# Patient Record
Sex: Female | Born: 2000 | Race: Black or African American | Marital: Single | Smoking: Never smoker
Health system: Northeastern US, Community
[De-identification: ages and names within clinical notes are randomized; demographics above are authoritative.]

## PROBLEM LIST (undated history)

## (undated) DIAGNOSIS — R7303 Prediabetes: Secondary | ICD-10-CM

## (undated) DIAGNOSIS — E559 Vitamin D deficiency, unspecified: Secondary | ICD-10-CM

## (undated) DIAGNOSIS — D352 Benign neoplasm of pituitary gland: Secondary | ICD-10-CM

## (undated) DIAGNOSIS — E079 Disorder of thyroid, unspecified: Secondary | ICD-10-CM

## (undated) DIAGNOSIS — E282 Polycystic ovarian syndrome: Secondary | ICD-10-CM

## (undated) HISTORY — DX: Vitamin D deficiency, unspecified: E55.9

## (undated) HISTORY — DX: Polycystic ovarian syndrome: E28.2

## (undated) HISTORY — DX: Benign neoplasm of pituitary gland: D35.2

## (undated) HISTORY — DX: Prediabetes: R73.03

## (undated) HISTORY — PX: HYDRADENITIS EXCISION: SHX5243

## (undated) HISTORY — PX: NO SIGNIFICANT SURGICAL HISTORY: 1000005

---
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
## 2019-08-15 ENCOUNTER — Ambulatory Visit

## 2019-08-15 NOTE — Progress Notes (Signed)
 OPHTHALMOLOGY CLINIC ENCOUNTER     Allergies:  No Known Allergies       Review of Systems:    Eyes: Complains of visual change or blurring. Denies vision loss - 1 eye, redness, double vision, vision loss - both eyes, eye pain, halos, discharge, light sensitivity, itching, burning, dry, tearing, flashes, floaters, foreign body sensations.        Examination:  Distant VCC:OD: 20/30   OS: 20/30   Orbit:   OD: wnl   OS: wnl     Pupils: OD: negRAPD/wnl   OS: negRAPD/wnl  Amsler Grid:  OD: wnl  OS: wnl  Confrontation Fields:  OD: wnl OS: wnl  EOM:  OD: normal   OS: normal   Lids and Adnexa: Right: wnl   Left: wnl               OD: 15     OS: 15   Neurologic / Psychiatric:    Oriented to time- place and person.,Mood and affect appropriate        External OD: No gross lesions or inflammation, lacrimal gland and lacrimal drainage normal, no pre auricular nodes   External OS: No gross lesions or inflammation, lacrimal gland and lacrimal drainage normal, no pre auricular nodes    Anterior Chamber OD: normal depth, no cells or flare, iris shape and morphology normal, normal direct and consensual reaction   Anterior Chamber OS: normal depth, no cells or flare, iris shape and morphology normal, normal direct and consensual reaction    Conjunctiva OD: bulbar, palpebral conjunctiva normal, no significant injection   Conjunctiva OS: bulbar, palpebral conjunctiva normal, no significant injection     Cornea OD: epithelium, stroma, endothelium all within normal limits, tear film normal   Cornea OS: epithelium, stroma, endothelium all within normal limits, tear film normal     Iris OD: No evidence of adhesions or rubeosis   Iris OS: No evidence of adhesions or rubeosis     Retina OD: optic disc size, c/d ratio, appearance within normal limits, nerve fiber layer normal, macula normal, vasculature normal, periphery intact  Retina OS: optic disc size, c/d ratio, appearance within normal limits,  nerve fiber layer normal, macula normal, vasculature normal, periphery intact    Lens OD: normal clarity, anterior and posterior capsule normal, cortex and nucleus normal  Lens OS: normal clarity, anterior and posterior capsule normal, cortex and nucleus normal    Gonioscopy OD:  Normal  Gonioscopy OS:  Normal        Assessment and Plan:    EXAMINATION OF EYES AND VISION  REFRACTION ERROR    Patient's Instructions   Return to officein 1  YEAR

## 2019-09-12 LAB — COVID-19 CARE EVERYWHERE: COVID-19 CARE EVERYWHERE: NOT DETECTED

## 2019-09-12 LAB — LAB EXTERNAL RESULT UNMAPPED: COVID-19 CARE EVERYWHERE COMMENT: 20201120

## 2019-11-08 LAB — HEMOGLOBIN A1C CARE EVERYWHERE
HEMOGLOBIN A1C CARE EVERYWHERE: 5.6 % (ref 4.3–6.4)
MEAN BLOOD GLUCOSE CARE EVERYWHERE: 114 mg/dL

## 2020-03-06 LAB — TYPE AND SCREEN CARE EVERYWHERE
ANTIBODY SCREEN CARE EVERYWHERE: NEGATIVE
RH TYPE CARE EVERYWHERE: POSITIVE

## 2020-09-28 ENCOUNTER — Encounter: Payer: Self-pay | Admitting: Emergency Medicine

## 2020-09-28 ENCOUNTER — Other Ambulatory Visit: Payer: Self-pay

## 2020-09-28 ENCOUNTER — Emergency Department
Admission: EM | Admit: 2020-09-28 | Discharge: 2020-09-28 | Disposition: A | Discharge status: Home / Self Care | Payer: BC Managed Care – PPO | Attending: Emergency Medicine | Admitting: Emergency Medicine

## 2020-09-28 DIAGNOSIS — R070 Pain in throat: Secondary | ICD-10-CM | POA: Diagnosis present

## 2020-09-28 DIAGNOSIS — J029 Acute pharyngitis, unspecified: Secondary | ICD-10-CM | POA: Insufficient documentation

## 2020-09-28 DIAGNOSIS — F17200 Nicotine dependence, unspecified, uncomplicated: Secondary | ICD-10-CM | POA: Insufficient documentation

## 2020-09-28 MED ORDER — LIDOCAINE VISCOUS HCL 2 % MT SOLN: 10.0000 mL | OROMUCOSAL | 0 refills | Status: DC | PRN

## 2020-09-28 NOTE — ED Notes (Signed)
Pt to ED stating sore throat since 2d. Can swallow liquids and foods. Pt is concerned about intermittent, sharp R flank pain since 1 month. Denies pain or burning with urination.  Had negative respiratory panel 2d ago: covid, influenza, strep all negative.

## 2020-09-28 NOTE — ED Provider Notes (Signed)
[facility] Emergency Department Provider Note  ____________________________________________  Time seen: Approximately 11:56 AM  I have reviewed the triage vital signs and the nursing notes.   HISTORY  Chief Complaint Sore Throat    HPI Annette Horn is a 19 y.o. female that presents to the emergency department for evaluation of sore throat for 3 days. Patient had a negative Covid, flu, strep test 2 days ago. Patient was recommended to come to the emergency department by her job if her symptoms had not improved over the weekend because she is supposed to work today. Patient states that her throat is still occasionally irritated so she did not go to work today. She requests a note. No fever, cough, shortness of breath.   History reviewed. No pertinent past medical history.  There are no problems to display for this patient.   Past Surgical History:  Procedure Laterality Date  . HYDRADENITIS EXCISION      Prior to Admission medications   Medication Sig Start Date End Date Taking? Authorizing Provider  lidocaine (XYLOCAINE) 2 % solution Use as directed 10 mLs in the mouth or throat as needed. 09/28/20   Laban Emperor, PA-C    Allergies Patient has no known allergies.  No family history on file.  Social History Social History   Tobacco Use  . Smoking status: Current Every Day Smoker  . Smokeless tobacco: Never Used  Substance Use Topics  . Alcohol use: Yes    Comment: Rare  . Drug use: Never     Review of Systems  Constitutional: No fever/chills Eyes: No visual changes. No discharge. ENT: Negative for congestion and rhinorrhea. Positive for throat irritation. Cardiovascular: No chest pain. Respiratory: Negative for cough. No SOB. Gastrointestinal: No abdominal pain.  No nausea, no vomiting.  No diarrhea.  No constipation. Musculoskeletal: Negative for musculoskeletal pain. Skin: Negative for rash, abrasions, lacerations,  ecchymosis. Neurological: Negative for headaches.   ____________________________________________   PHYSICAL EXAM:  VITAL SIGNS: ED Triage Vitals  Enc Vitals Group     BP 09/28/20 0906 124/89     Pulse Rate 09/28/20 0906 78     Resp 09/28/20 0906 20     Temp 09/28/20 0906 98.7 F (37.1 C)     Temp Source 09/28/20 0906 Oral     SpO2 09/28/20 0906 98 %     Weight 09/28/20 0907 190 lb (86.2 kg)     Height 09/28/20 0907 5\' 3"  (1.6 m)     Head Circumference --      Peak Flow --      Pain Score 09/28/20 0907 8     Pain Loc --      Pain Edu? --      Excl. in [place]? --      Constitutional: Alert and oriented. Well appearing and in no acute distress. Eyes: Conjunctivae are normal. PERRL. EOMI. No discharge. Head: Atraumatic. ENT: No frontal and maxillary sinus tenderness.      Ears: Tympanic membranes pearly gray with good landmarks. No discharge.      Nose: No congestion/rhinnorhea.      Mouth/Throat: Mucous membranes are moist. Oropharynx minimally erythematous. Tonsils not enlarged. No exudates. Uvula midline. Neck: No stridor.   Hematological/Lymphatic/Immunilogical: No cervical lymphadenopathy. Cardiovascular: Normal rate, regular rhythm.  Good peripheral circulation. Respiratory: Normal respiratory effort without tachypnea or retractions. Lungs CTAB. Good air entry to the bases with no decreased or absent breath sounds. Gastrointestinal: Bowel sounds 4 quadrants. Soft and nontender to palpation. No  guarding or rigidity. No palpable masses. No distention. Musculoskeletal: Full range of motion to all extremities. No gross deformities appreciated. Neurologic:  Normal speech and language. No gross focal neurologic deficits are appreciated.  Skin:  Skin is warm, dry and intact. No rash noted. Psychiatric: Mood and affect are normal. Speech and behavior are normal. Patient exhibits appropriate insight and judgement.   ____________________________________________   LABS (all  labs ordered are listed, but only abnormal results are displayed)  Labs Reviewed - No data to display ____________________________________________  EKG   ____________________________________________  RADIOLOGY   No results found.  ____________________________________________    PROCEDURES  Procedure(s) performed:    Procedures    Medications - No data to display   ____________________________________________   INITIAL IMPRESSION / ASSESSMENT AND PLAN / ED COURSE  Pertinent labs & imaging results that were available during my care of the patient were reviewed by me and considered in my medical decision making (see chart for details).  Review of the [place] CSRS was performed in accordance of the [place] prior to dispensing any controlled drugs.     Patient's diagnosis is consistent with viral pharyngitis. Vital signs and exam are reassuring. Patient appears well and is staying well hydrated. Patient feels comfortable going home. Patient will be discharged home with prescriptions for viscous lidocaine. Patient is to follow up with primary care as needed or otherwise directed. Patient is given ED precautions to return to the ED for any worsening or new symptoms.  Annette Horn was evaluated in Emergency Department on 09/28/2020 for the symptoms described in the history of present illness. She was evaluated in the context of the global COVID-19 pandemic, which necessitated consideration that the patient might be at risk for infection with the SARS-CoV-2 virus that causes COVID-19. Institutional protocols and algorithms that pertain to the evaluation of patients at risk for COVID-19 are in a state of rapid change based on information released by regulatory bodies including the CDC and federal and state organizations. These policies and algorithms were followed during the patient's care in the ED.   ____________________________________________  FINAL CLINICAL IMPRESSION(S) /  ED DIAGNOSES  Final diagnoses:  Viral pharyngitis      NEW MEDICATIONS STARTED DURING THIS VISIT:  ED Discharge Orders         Ordered    lidocaine (XYLOCAINE) 2 % solution  As needed        09/28/20 1205              This chart was dictated using voice recognition software/Dragon. Despite best efforts to proofread, errors can occur which can change the meaning. Any change was purely unintentional.    Laban Emperor, PA-C 09/28/20 1505    Harvest Dark, MD 09/28/20 [phone]

## 2020-09-28 NOTE — ED Notes (Signed)
Pt ambulatory to restroom

## 2020-09-28 NOTE — ED Triage Notes (Signed)
Pt to ED via POV with c/o sore throat x several days. Pt states on Thursday had negative strep and covid tests. Pt states continues to have sore throat. Pt able to speak in full and complete sentences, NAD noted at this time.

## 2021-01-10 ENCOUNTER — Other Ambulatory Visit: Payer: Self-pay

## 2021-01-10 ENCOUNTER — Emergency Department: Payer: 59

## 2021-01-10 ENCOUNTER — Emergency Department
Admission: EM | Admit: 2021-01-10 | Discharge: 2021-01-10 | Disposition: A | Discharge status: Home / Self Care | Payer: 59 | Attending: Emergency Medicine | Admitting: Emergency Medicine

## 2021-01-10 ENCOUNTER — Encounter: Payer: Self-pay | Admitting: Emergency Medicine

## 2021-01-10 DIAGNOSIS — S99921A Unspecified injury of right foot, initial encounter: Secondary | ICD-10-CM | POA: Diagnosis present

## 2021-01-10 DIAGNOSIS — X501XXA Overexertion from prolonged static or awkward postures, initial encounter: Secondary | ICD-10-CM | POA: Insufficient documentation

## 2021-01-10 DIAGNOSIS — S93601A Unspecified sprain of right foot, initial encounter: Secondary | ICD-10-CM | POA: Insufficient documentation

## 2021-01-10 DIAGNOSIS — Y9302 Activity, running: Secondary | ICD-10-CM | POA: Diagnosis not present

## 2021-01-10 DIAGNOSIS — F17200 Nicotine dependence, unspecified, uncomplicated: Secondary | ICD-10-CM | POA: Diagnosis not present

## 2021-01-10 DIAGNOSIS — Y92481 Parking lot as the place of occurrence of the external cause: Secondary | ICD-10-CM | POA: Diagnosis not present

## 2021-01-10 MED ORDER — NAPROXEN 500 MG PO TABS: 500.0000 mg | ORAL_TABLET | Freq: Two times a day (BID) | ORAL | Status: DC

## 2021-01-10 NOTE — ED Provider Notes (Signed)
[facility] Emergency Department Provider Note   ____________________________________________   Event Date/Time   First MD Initiated Contact with Patient 01/10/21 [phone]     (approximate)  I have reviewed the triage vital signs and the nursing notes.   HISTORY  Chief Complaint Foot Pain    HPI Annette Horn is a 20 y.o. female patient pain of right lateral foot pain status post running down steps and missed someone the stairs.  Patient did pain increased when walking in the parking lot today.  Patient is able to swallow from yesterday.  Denies loss of sensation.  Rates pain as 8/10.  Described pain as "achy".  No palliative measure for complaint.         History reviewed. No pertinent past medical history.  There are no problems to display for this patient.   Past Surgical History:  Procedure Laterality Date  . HYDRADENITIS EXCISION      Prior to Admission medications   Medication Sig Start Date End Date Taking? Authorizing Provider  naproxen (NAPROSYN) 500 MG tablet Take 1 tablet (500 mg total) by mouth 2 (two) times daily with a meal. 01/10/21  Yes Sable Feil, PA-C    Allergies Patient has no known allergies.  History reviewed. No pertinent family history.  Social History Social History   Tobacco Use  . Smoking status: Current Every Day Smoker  . Smokeless tobacco: Never Used  Substance Use Topics  . Alcohol use: Yes    Comment: Rare  . Drug use: Never    Review of Systems Constitutional: No fever/chills Eyes: No visual changes. ENT: No sore throat. Cardiovascular: Denies chest pain. Respiratory: Denies shortness of breath. Gastrointestinal: No abdominal pain.  No nausea, no vomiting.  No diarrhea.  No constipation. Genitourinary: Negative for dysuria. Musculoskeletal: Right lateral foot pain.  Skin: Negative for rash. Neurological: Negative for headaches, focal weakness or  numbness.   ____________________________________________   PHYSICAL EXAM:  VITAL SIGNS: ED Triage Vitals  Enc Vitals Group     BP 01/10/21 0816 115/81     Pulse Rate 01/10/21 0816 74     Resp 01/10/21 0816 (!) 22     Temp 01/10/21 0816 98 F (36.7 C)     Temp Source 01/10/21 0816 Oral     SpO2 01/10/21 0816 98 %     Weight 01/10/21 0804 180 lb (81.6 kg)     Height 01/10/21 0804 5\' 4"  (1.626 m)     Head Circumference --      Peak Flow --      Pain Score 01/10/21 0803 8     Pain Loc --      Pain Edu? --      Excl. in [place]? --    Constitutional: Alert and oriented. Well appearing and in no acute distress. Cardiovascular: Normal rate, regular rhythm. Grossly normal heart sounds.  Good peripheral circulation. Respiratory: Normal respiratory effort.  No retractions. Lungs CTAB. Musculoskeletal: No obvious deformities to the right foot.  Moderate guarding palpation of the fifth metatarsal. Neurologic:  Normal speech and language. No gross focal neurologic deficits are appreciated. No gait instability. Skin:  Skin is warm, dry and intact. No rash noted. Psychiatric: Mood and affect are normal. Speech and behavior are normal.  ____________________________________________   LABS (all labs ordered are listed, but only abnormal results are displayed)  Labs Reviewed - No data to display ____________________________________________  EKG   ____________________________________________  RADIOLOGY Cecilio Asper, personally viewed  and evaluated these images (plain radiographs) as part of my medical decision making, as well as reviewing the written report by the radiologist.  ED MD interpretation: No acute findings x-ray of the right foot. Official radiology report(s): DG Foot Complete Right  Result Date: 01/10/2021 CLINICAL DATA:  Right foot pain after injury yesterday. EXAM: RIGHT FOOT COMPLETE - 3+ VIEW COMPARISON:  None. FINDINGS: There is no evidence of fracture or  dislocation. There is no evidence of arthropathy or other focal bone abnormality. Soft tissues are unremarkable. IMPRESSION: Negative. Electronically Signed   By: Marijo Conception M.D.   On: 01/10/2021 08:54    ____________________________________________   PROCEDURES  Procedure(s) performed (including Critical Care):  Procedures   ____________________________________________   INITIAL IMPRESSION / ASSESSMENT AND PLAN / ED COURSE  As part of my medical decision making, I reviewed the following data within the [place]         Patient presents with 2 days of right lateral foot pain.  Discussed no acute findings on x-ray of the right foot.  Patient complaining physical exam consistent with sprain foot.  Patient foot was Ace wrapped and she is given crutches to assist with ambulation.  Take medication as directed.      ____________________________________________   FINAL CLINICAL IMPRESSION(S) / ED DIAGNOSES  Final diagnoses:  Sprain of right foot, initial encounter     ED Discharge Orders         Ordered    naproxen (NAPROSYN) 500 MG tablet  2 times daily with meals        01/10/21 5631          *Please note:  Annette Horn was evaluated in Emergency Department on 01/10/2021 for the symptoms described in the history of present illness. She was evaluated in the context of the global COVID-19 pandemic, which necessitated consideration that the patient might be at risk for infection with the SARS-CoV-2 virus that causes COVID-19. Institutional protocols and algorithms that pertain to the evaluation of patients at risk for COVID-19 are in a state of rapid change based on information released by regulatory bodies including the CDC and federal and state organizations. These policies and algorithms were followed during the patient's care in the ED.  Some ED evaluations and interventions may be delayed as a result of limited staffing during and the  pandemic.*   Note:  This document was prepared using Dragon voice recognition software and may include unintentional dictation errors.    Sable Feil, PA-C 01/10/21 4970    Carrie Mew, MD 01/10/21 [phone]

## 2021-01-10 NOTE — Discharge Instructions (Signed)
Follow discharge care instruction take medication as directed. °

## 2021-01-10 NOTE — ED Triage Notes (Signed)
Pt c/o R foot pain, states sharp pain while running down some stairs, then states injured again while walking in the parking lot. Pt ambulatory to triage desk at this time.

## 2021-01-10 NOTE — ED Notes (Signed)
See triage note  Presents with right foot pain   States she stepped off step wrong   Twisted foot  Pain to lateral aspect of foot  Good pulses

## 2021-03-03 ENCOUNTER — Emergency Department
Admission: EM | Admit: 2021-03-03 | Discharge: 2021-03-03 | Disposition: A | Discharge status: Home / Self Care | Payer: 59 | Attending: Emergency Medicine | Admitting: Emergency Medicine

## 2021-03-03 ENCOUNTER — Other Ambulatory Visit: Payer: Self-pay

## 2021-03-03 ENCOUNTER — Emergency Department: Payer: 59

## 2021-03-03 DIAGNOSIS — R197 Diarrhea, unspecified: Secondary | ICD-10-CM | POA: Diagnosis present

## 2021-03-03 DIAGNOSIS — R110 Nausea: Secondary | ICD-10-CM | POA: Insufficient documentation

## 2021-03-03 DIAGNOSIS — R509 Fever, unspecified: Secondary | ICD-10-CM | POA: Diagnosis not present

## 2021-03-03 DIAGNOSIS — Z7984 Long term (current) use of oral hypoglycemic drugs: Secondary | ICD-10-CM | POA: Insufficient documentation

## 2021-03-03 DIAGNOSIS — F17200 Nicotine dependence, unspecified, uncomplicated: Secondary | ICD-10-CM | POA: Diagnosis not present

## 2021-03-03 DIAGNOSIS — R0789 Other chest pain: Secondary | ICD-10-CM | POA: Insufficient documentation

## 2021-03-03 DIAGNOSIS — R1010 Upper abdominal pain, unspecified: Secondary | ICD-10-CM | POA: Diagnosis not present

## 2021-03-03 LAB — URINALYSIS, COMPLETE (UACMP) WITH MICROSCOPIC
Bacteria, UA: NONE SEEN
Bilirubin Urine: NEGATIVE
Glucose, UA: NEGATIVE mg/dL
Hgb urine dipstick: NEGATIVE
Ketones, ur: NEGATIVE mg/dL
Leukocytes,Ua: NEGATIVE
Nitrite: NEGATIVE
Protein, ur: NEGATIVE mg/dL
Specific Gravity, Urine: 1.025 (ref 1.005–1.030)
pH: 6 (ref 5.0–8.0)

## 2021-03-03 LAB — COMPREHENSIVE METABOLIC PANEL
ALT: 53 U/L — ABNORMAL HIGH (ref 0–44)
AST: 24 U/L (ref 15–41)
Albumin: 3.8 g/dL (ref 3.5–5.0)
Alkaline Phosphatase: 47 U/L (ref 38–126)
Anion gap: 6 (ref 5–15)
BUN: 9 mg/dL (ref 6–20)
CO2: 25 mmol/L (ref 22–32)
Calcium: 9.3 mg/dL (ref 8.9–10.3)
Chloride: 103 mmol/L (ref 98–111)
Creatinine, Ser: 0.58 mg/dL (ref 0.44–1.00)
GFR, Estimated: >60 mL/min (ref >60)
Glucose, Bld: 92 mg/dL (ref 70–99)
Potassium: 4 mmol/L (ref 3.5–5.1)
Sodium: 134 mmol/L — ABNORMAL LOW (ref 135–145)
Total Bilirubin: 0.4 mg/dL (ref 0.3–1.2)
Total Protein: 7.1 g/dL (ref 6.5–8.1)

## 2021-03-03 LAB — CBC WITH DIFFERENTIAL/PLATELET
Abs Immature Granulocytes: 0.03 10*3/uL (ref 0.00–0.07)
Basophils Absolute: 0 10*3/uL (ref 0.0–0.1)
Basophils Relative: 0 %
Eosinophils Absolute: 0.2 10*3/uL (ref 0.0–0.5)
Eosinophils Relative: 4 %
HCT: 38.1 % (ref 36.0–46.0)
Hemoglobin: 12.6 g/dL (ref 12.0–15.0)
Immature Granulocytes: 1 %
Lymphocytes Relative: 43 %
Lymphs Abs: 2.6 10*3/uL (ref 0.7–4.0)
MCH: 27.1 pg (ref 26.0–34.0)
MCHC: 33.1 g/dL (ref 30.0–36.0)
MCV: 81.9 fL (ref 80.0–100.0)
Monocytes Absolute: 0.5 10*3/uL (ref 0.1–1.0)
Monocytes Relative: 8 %
Neutro Abs: 2.7 10*3/uL (ref 1.7–7.7)
Neutrophils Relative %: 44 %
Platelets: 167 10*3/uL (ref 150–400)
RBC: 4.65 MIL/uL (ref 3.87–5.11)
RDW: 12.9 % (ref 11.5–15.5)
WBC: 6 10*3/uL (ref 4.0–10.5)
nRBC: 0 % (ref 0.0–0.2)

## 2021-03-03 LAB — POC URINE PREG, ED: Preg Test, Ur: NEGATIVE

## 2021-03-03 LAB — LIPASE, BLOOD: Lipase: 31 U/L (ref 11–51)

## 2021-03-03 MED ORDER — MELOXICAM 15 MG PO TABS: 15.0000 mg | ORAL_TABLET | Freq: Every day | ORAL | 0 refills | Status: DC

## 2021-03-03 NOTE — ED Notes (Signed)
See triage note  Presents with pain under lateral left breast area   Pain increases with inspiration  States she had fever yesterday of 101 but is currently afebrile on arrival

## 2021-03-03 NOTE — ED Triage Notes (Signed)
Pt states that she started with left sided abd pain yesterday with a temp of 100.2, pt states that she has also been having diarrhea

## 2021-03-03 NOTE — ED Provider Notes (Signed)
[facility] Emergency Department Provider Note ____________________________________________   Event Date/Time   First MD Initiated Contact with Patient 03/03/21 0740     (approximate)  I have reviewed the triage vital signs and the nursing notes.   HISTORY  Chief Complaint Abdominal Pain  HPI Annette Horn is a 20 y.o. female with history of PCOS presents to the emergency department for treatment and evaluation of left side upper abdominal pain that started yesterday.  She also reports a fever of 100.2 and watery stools.  She also mentions unintentional weight loss over the past week or so.  Incidentally, she started taking metformin around the time of symptom onset.  She reports that her menstrual cycle is a few days late and has had some nausea without vomiting.  No alleviating measures taken prior to arrival.         No past medical history on file.  There are no problems to display for this patient.   Past Surgical History:  Procedure Laterality Date  . HYDRADENITIS EXCISION      Prior to Admission medications   Medication Sig Start Date End Date Taking? Authorizing Provider  albuterol (VENTOLIN HFA) 108 (90 Base) MCG/ACT inhaler Inhale 2 puffs into the lungs every 6 (six) hours as needed for wheezing or shortness of breath.   Yes [provider]  fluticasone furoate-vilanterol (BREO ELLIPTA) 200-25 MCG/INH AEPB Inhale 1 puff into the lungs daily.   Yes [provider]  meloxicam (MOBIC) 15 MG tablet Take 1 tablet (15 mg total) by mouth daily. 03/03/21  Yes Melvin Marmo B, FNP  metFORMIN (GLUCOPHAGE) 500 MG tablet Take by mouth 2 (two) times daily with a meal.   Yes [provider]    Allergies Patient has no known allergies.  No family history on file.  Social History Social History   Tobacco Use  . Smoking status: Current Every Day Smoker  . Smokeless tobacco: Never Used  Substance Use Topics  .  Alcohol use: Yes    Comment: Rare  . Drug use: Never    Review of Systems  Constitutional: No fever/chills Eyes: No visual changes. ENT: No sore throat. Cardiovascular: Denies chest pain. Respiratory: Denies shortness of breath. Gastrointestinal: Positive abdominal pain.  Positive nausea, no vomiting.  Positive diarrhea.  No constipation. Genitourinary: Negative for dysuria. Musculoskeletal: Negative for back pain. Skin: Negative for rash. Neurological: Negative for headaches, focal weakness or numbness.  ____________________________________________   PHYSICAL EXAM:  VITAL SIGNS: ED Triage Vitals  Enc Vitals Group     BP 03/03/21 0727 112/88     Pulse Rate 03/03/21 0727 85     Resp 03/03/21 0727 16     Temp 03/03/21 0727 98.5 F (36.9 C)     Temp Source 03/03/21 0727 Oral     SpO2 03/03/21 0727 99 %     Weight 03/03/21 0728 187 lb (84.8 kg)     Height 03/03/21 0728 5\' 3"  (1.6 m)     Head Circumference --      Peak Flow --      Pain Score 03/03/21 0728 9     Pain Loc --      Pain Edu? --      Excl. in [place]? --     Constitutional: Alert and oriented. Well appearing and in no acute distress. Eyes: Conjunctivae are normal. PERRL. EOMI. Head: Atraumatic. Nose: No congestion/rhinnorhea. Mouth/Throat: Mucous membranes are moist.  Oropharynx non-erythematous. Neck: No stridor.  Hematological/Lymphatic/Immunilogical: No cervical lymphadenopathy. Cardiovascular: Normal rate, regular rhythm. Grossly normal heart sounds.  Good peripheral circulation. Respiratory: Normal respiratory effort.  No retractions. Lungs CTAB. Gastrointestinal: Soft and nontender. No distention. No abdominal bruits. No CVA tenderness. Genitourinary:  Musculoskeletal: No lower extremity tenderness nor edema.  No joint effusions. Neurologic:  Normal speech and language. No gross focal neurologic deficits are appreciated. No gait instability. Skin:  Skin is warm, dry and intact. No rash  noted. Psychiatric: Mood and affect are normal. Speech and behavior are normal.  ____________________________________________   LABS (all labs ordered are listed, but only abnormal results are displayed)  Labs Reviewed  URINALYSIS, COMPLETE (UACMP) WITH MICROSCOPIC - Abnormal; Notable for the following components:      Result Value   Color, Urine YELLOW (*)    APPearance CLEAR (*)    All other components within normal limits  COMPREHENSIVE METABOLIC PANEL - Abnormal; Notable for the following components:   Sodium 134 (*)    ALT 53 (*)    All other components within normal limits  CBC WITH DIFFERENTIAL/PLATELET  LIPASE, BLOOD  POC URINE PREG, ED   ____________________________________________  EKG  Not indicated. ____________________________________________  RADIOLOGY  ED MD interpretation:    Chest x-ray showing no acute concerns.  I, Sherrie George, personally viewed and evaluated these images (plain radiographs) as part of my medical decision making, as well as reviewing the written report by the radiologist.  Official radiology report(s): DG Chest 2 View  Result Date: 03/03/2021 CLINICAL DATA:  Patient c/o fever, pleuritic chest pain and nausea. That started yesterday. Denies other symptoms. EXAM: CHEST - 2 VIEW COMPARISON:  None. FINDINGS: Normal heart, mediastinum and hila. Clear lungs. No pleural effusion or pneumothorax. Skeletal structures are within normal limits. IMPRESSION: Normal chest radiographs. Electronically Signed   By: Lajean Manes M.D.   On: 03/03/2021 08:23    ____________________________________________   PROCEDURES  Procedure(s) performed (including Critical Care):  Procedures  ____________________________________________   INITIAL IMPRESSION / ASSESSMENT AND PLAN     20 year old female presents to the emergency department for treatment and evaluation of pain under the left lateral breast area that increases with inspiration and movement.   No known injury.  She is also had some weight loss and diarrhea for the past several days.  DIFFERENTIAL DIAGNOSIS  Diverticulitis/diverticulosis, gastroenteritis, atypical chest pain, costochondritis, PE  ED COURSE  Vital signs are not indicative of PE.  She is PERC negative.  Patient mentions that she started metformin couple weeks ago.  This is likely the cause of her diarrhea and weight loss.  Pain in her chest wall is reproducible with movement and deep inspiration.  Plan will be to have her discuss diarrhea and weight loss with her primary care provider.  She will continue the metformin in the meantime.  She was encouraged to use probiotic that likely help with diarrhea.  She will be treated with meloxicam for chest wall pain.  She was encouraged to return to the emergency department or see primary care for symptoms that change, worsen, or do not improve.    ___________________________________________   FINAL CLINICAL IMPRESSION(S) / ED DIAGNOSES  Final diagnoses:  Diarrhea, unspecified type  Chest wall pain     ED Discharge Orders         Ordered    meloxicam (MOBIC) 15 MG tablet  Daily        03/03/21 [place] was  evaluated in Emergency Department on 03/03/2021 for the symptoms described in the history of present illness. She was evaluated in the context of the global COVID-19 pandemic, which necessitated consideration that the patient might be at risk for infection with the SARS-CoV-2 virus that causes COVID-19. Institutional protocols and algorithms that pertain to the evaluation of patients at risk for COVID-19 are in a state of rapid change based on information released by regulatory bodies including the CDC and federal and state organizations. These policies and algorithms were followed during the patient's care in the ED.   Note:  This document was prepared using Dragon voice recognition software and may include unintentional dictation  errors.   Victorino Dike, FNP 03/03/21 1527    Lavonia Drafts, MD 03/04/21 1324

## 2021-03-03 NOTE — Discharge Instructions (Signed)
Taking a probiotic will help with diarrhea.  Your symptoms are most likely related to Metformin.  Follow up with primary care if not improving over the next few days.  Return to the ER for symptoms that change or worsen if unable to schedule an appointment.

## 2021-07-13 ENCOUNTER — Encounter: Payer: Self-pay | Admitting: Emergency Medicine

## 2021-07-13 ENCOUNTER — Emergency Department
Admission: EM | Admit: 2021-07-13 | Discharge: 2021-07-13 | Disposition: A | Discharge status: Home / Self Care | Payer: Self-pay | Attending: Emergency Medicine | Admitting: Emergency Medicine

## 2021-07-13 ENCOUNTER — Emergency Department: Payer: Self-pay

## 2021-07-13 ENCOUNTER — Other Ambulatory Visit: Payer: Self-pay

## 2021-07-13 DIAGNOSIS — Z3A01 Less than 8 weeks gestation of pregnancy: Secondary | ICD-10-CM | POA: Insufficient documentation

## 2021-07-13 DIAGNOSIS — R197 Diarrhea, unspecified: Secondary | ICD-10-CM | POA: Insufficient documentation

## 2021-07-13 DIAGNOSIS — R112 Nausea with vomiting, unspecified: Secondary | ICD-10-CM

## 2021-07-13 DIAGNOSIS — O219 Vomiting of pregnancy, unspecified: Secondary | ICD-10-CM | POA: Insufficient documentation

## 2021-07-13 DIAGNOSIS — F17200 Nicotine dependence, unspecified, uncomplicated: Secondary | ICD-10-CM | POA: Insufficient documentation

## 2021-07-13 DIAGNOSIS — Z3491 Encounter for supervision of normal pregnancy, unspecified, first trimester: Secondary | ICD-10-CM

## 2021-07-13 DIAGNOSIS — R109 Unspecified abdominal pain: Secondary | ICD-10-CM | POA: Insufficient documentation

## 2021-07-13 LAB — CBC
HCT: 40.1 % (ref 36.0–46.0)
Hemoglobin: 13.9 g/dL (ref 12.0–15.0)
MCH: 28.5 pg (ref 26.0–34.0)
MCHC: 34.7 g/dL (ref 30.0–36.0)
MCV: 82.2 fL (ref 80.0–100.0)
Platelets: 168 10*3/uL (ref 150–400)
RBC: 4.88 MIL/uL (ref 3.87–5.11)
RDW: 13.4 % (ref 11.5–15.5)
WBC: 6 10*3/uL (ref 4.0–10.5)
nRBC: 0 % (ref 0.0–0.2)

## 2021-07-13 LAB — URINALYSIS, COMPLETE (UACMP) WITH MICROSCOPIC
Bacteria, UA: NONE SEEN
Bilirubin Urine: NEGATIVE
Glucose, UA: NEGATIVE mg/dL
Hgb urine dipstick: NEGATIVE
Leukocytes,Ua: NEGATIVE
Nitrite: NEGATIVE
Specific Gravity, Urine: 1.02 (ref 1.005–1.030)
pH: 7 (ref 5.0–8.0)

## 2021-07-13 LAB — COMPREHENSIVE METABOLIC PANEL
ALT: 48 U/L — ABNORMAL HIGH (ref 0–44)
AST: 33 U/L (ref 15–41)
Albumin: 4.2 g/dL (ref 3.5–5.0)
Alkaline Phosphatase: 48 U/L (ref 38–126)
Anion gap: 9 (ref 5–15)
BUN: 7 mg/dL (ref 6–20)
CO2: 25 mmol/L (ref 22–32)
Calcium: 9.4 mg/dL (ref 8.9–10.3)
Chloride: 104 mmol/L (ref 98–111)
Creatinine, Ser: 0.62 mg/dL (ref 0.44–1.00)
GFR, Estimated: >60 mL/min (ref >60)
Glucose, Bld: 108 mg/dL — ABNORMAL HIGH (ref 70–99)
Potassium: 3.6 mmol/L (ref 3.5–5.1)
Sodium: 138 mmol/L (ref 135–145)
Total Bilirubin: 0.8 mg/dL (ref 0.3–1.2)
Total Protein: 7.5 g/dL (ref 6.5–8.1)

## 2021-07-13 LAB — HCG, QUANTITATIVE, PREGNANCY: hCG, Beta Chain, Quant, S: 5087 m[IU]/mL — ABNORMAL HIGH (ref <5)

## 2021-07-13 LAB — POC URINE PREG, ED: Preg Test, Ur: POSITIVE — AB

## 2021-07-13 LAB — LIPASE, BLOOD: Lipase: 26 U/L (ref 11–51)

## 2021-07-13 MED ORDER — VITAMIN B-6 50 MG PO TABS
100.0000 mg | ORAL_TABLET | ORAL | Status: AC
  Administered 2021-07-13: 100 mg via ORAL
  Filled 2021-07-13: qty 2

## 2021-07-13 MED ORDER — DOXYLAMINE SUCCINATE (SLEEP) 25 MG PO TABS
25.0000 mg | ORAL_TABLET | ORAL | Status: AC
  Administered 2021-07-13: 25 mg via ORAL
  Filled 2021-07-13: qty 1

## 2021-07-13 MED ORDER — ACETAMINOPHEN 500 MG PO TABS
1000.0000 mg | ORAL_TABLET | Freq: Once | ORAL | Status: AC
  Administered 2021-07-13: 1000 mg via ORAL
  Filled 2021-07-13: qty 2

## 2021-07-13 MED ORDER — DOXYLAMINE-PYRIDOXINE 10-10 MG PO TBEC: 1.0000 | DELAYED_RELEASE_TABLET | Freq: Two times a day (BID) | ORAL | 0 refills | Status: AC | PRN

## 2021-07-13 NOTE — ED Triage Notes (Signed)
Pt reports found out last week she was pregnant and for the last few days she has had diarrhea and intermittent cramps that feel like period cramps. Pt reports not able to get in with OB yet. Pt denies vaginal bleeding or NV

## 2021-07-13 NOTE — Discharge Instructions (Addendum)
This very important to have repeat ultrasound and hCG levels with the next 2 weeks.  If you are unable to get into an OB practice anywhere within this time you may return to the emergency room to have this done.  Your Ultrasound today showed: FINDINGS: Intrauterine gestational sac: Single   Yolk sac:  Not Visualized.   Embryo:  Not Visualized.   Cardiac Activity: Not Visualized.   Heart Rate: n/a  bpm   MSD: 5.5 mm   by w   2 d   Subchorionic hemorrhage:  None visualized.   Maternal uterus/adnexae: The ovaries are normal in appearance.   IMPRESSION: Early intrauterine gestational sac with no yolk sac, fetal pole, or cardiac activity yet visualized. Recommend follow-up quantitative B-HCG levels and follow-up US in 14 days to assess viability. This recommendation follows SRU consensus guidelines: Diagnostic Criteria for Nonviable Pregnancy Early in the First Trimester. [place] Med [id number].

## 2021-07-13 NOTE — ED Provider Notes (Signed)
[facility] Emergency Department Provider Note  ____________________________________________   Event Date/Time   First MD Initiated Contact with Patient 07/13/21 1054     (approximate)  I have reviewed the triage vital signs and the nursing notes.   HISTORY  Chief Complaint Diarrhea and Abdominal Pain   HPI CASSEE THORNES is a 20 y.o. female without significant past medical history and having recently found she was pregnant about a week ago with last LMP August 9 having not yet been seen by OB who presents for assessment of 2 to 3 days of nonbloody nonbilious nausea, vomiting and nonbloody diarrhea as well as some crampy abdominal pain.  Patient has never been pregnant before.  She denies any headache, earache, sore throat, chest pain, cough, shortness of breath, back pain, burning with urination, vaginal bleeding or discharge, rash or extremity pain.  No recent falls or injuries.  No recent medications illicit drug use or EtOH use.  No other acute concerns at this time.  No medicines prior to arrival for her symptoms.         History reviewed. No pertinent past medical history.  There are no problems to display for this patient.   Past Surgical History:  Procedure Laterality Date   HYDRADENITIS EXCISION      Prior to Admission medications   Medication Sig Start Date End Date Taking? Authorizing Provider  Doxylamine-Pyridoxine (DICLEGIS) 10-10 MG TBEC Take 1 tablet by mouth 2 (two) times daily as needed. 07/13/21  Yes Lucrezia Starch, MD  albuterol (VENTOLIN HFA) 108 (90 Base) MCG/ACT inhaler Inhale 2 puffs into the lungs every 6 (six) hours as needed for wheezing or shortness of breath.    [provider]  fluticasone furoate-vilanterol (BREO ELLIPTA) 200-25 MCG/INH AEPB Inhale 1 puff into the lungs daily.    [provider]  metFORMIN (GLUCOPHAGE) 500 MG tablet Take by mouth 2 (two) times daily with a meal.    [provider]    Allergies Patient has no known allergies.  No family history on file.  Social History Social History   Tobacco Use   Smoking status: Every Day   Smokeless tobacco: Never  Substance Use Topics   Alcohol use: Yes    Comment: Rare   Drug use: Never    Review of Systems  Review of Systems  Constitutional:  Negative for chills and fever.  HENT:  Negative for sore throat.   Eyes:  Negative for pain.  Respiratory:  Negative for cough and stridor.   Cardiovascular:  Negative for chest pain.  Gastrointestinal:  Positive for abdominal pain, diarrhea, nausea and vomiting.  Genitourinary:  Negative for dysuria.  Musculoskeletal:  Negative for myalgias.  Skin:  Negative for rash.  Neurological:  Negative for seizures, loss of consciousness and headaches.  Psychiatric/Behavioral:  Negative for suicidal ideas.   All other systems reviewed and are negative.    ____________________________________________   PHYSICAL EXAM:  VITAL SIGNS: ED Triage Vitals  Enc Vitals Group     BP 07/13/21 0939 (!) 142/99     Pulse Rate 07/13/21 0939 78     Resp 07/13/21 0939 18     Temp 07/13/21 0939 98.3 F (36.8 C)     Temp Source 07/13/21 0939 Oral     SpO2 07/13/21 0939 99 %     Weight 07/13/21 0939 183 lb (83 kg)     Height 07/13/21 0939 '5\' 4"'$  (1.626 m)     Head Circumference --  Peak Flow --      Pain Score 07/13/21 0942 8     Pain Loc --      Pain Edu? --      Excl. in [place]? --    Vitals:   07/13/21 1059 07/13/21 1304  BP: (!) 134/97 (!) 136/92  Pulse: 94 87  Resp: 19 18  Temp:    SpO2: 100% 100%   Physical Exam Vitals and nursing note reviewed.  Constitutional:      General: She is not in acute distress.    Appearance: She is well-developed.  HENT:     Head: Normocephalic and atraumatic.     Right Ear: External ear normal.     Left Ear: External ear normal.     Nose: Nose normal.     Mouth/Throat:     Mouth: Mucous membranes are moist.  Eyes:      Conjunctiva/sclera: Conjunctivae normal.  Cardiovascular:     Rate and Rhythm: Normal rate and regular rhythm.     Heart sounds: No murmur heard. Pulmonary:     Effort: Pulmonary effort is normal. No respiratory distress.     Breath sounds: Normal breath sounds.  Abdominal:     Palpations: Abdomen is soft.     Tenderness: There is no abdominal tenderness.  Musculoskeletal:     Cervical back: Neck supple.  Skin:    General: Skin is warm and dry.     Capillary Refill: Capillary refill takes less than 2 seconds.  Neurological:     Mental Status: She is alert and oriented to person, place, and time.  Psychiatric:        Mood and Affect: Mood normal.     ____________________________________________   LABS (all labs ordered are listed, but only abnormal results are displayed)  Labs Reviewed  COMPREHENSIVE METABOLIC PANEL - Abnormal; Notable for the following components:      Result Value   Glucose, Bld 108 (*)    ALT 48 (*)    All other components within normal limits  URINALYSIS, COMPLETE (UACMP) WITH MICROSCOPIC - Abnormal; Notable for the following components:   Ketones, ur TRACE (*)    Protein, ur TRACE (*)    All other components within normal limits  HCG, QUANTITATIVE, PREGNANCY - Abnormal; Notable for the following components:   hCG, Beta Chain, Quant, S 5,087 (*)    All other components within normal limits  POC URINE PREG, ED - Abnormal; Notable for the following components:   Preg Test, Ur POSITIVE (*)    All other components within normal limits  LIPASE, BLOOD  CBC   ____________________________________________  EKG  ____________________________________________  RADIOLOGY  ED MD interpretation: OB ultrasound shows a early gestational sac with no yet visualized yolk sac fetal pole or cardiac cavity.  No other acute normalities noted.   Official radiology report(s): US OB Comp Less 14 Wks  Result Date: 07/13/2021 CLINICAL DATA:  Abdominal pain EXAM:  OBSTETRIC <14 WK [place] AND TRANSVAGINAL OB US TECHNIQUE: Both transabdominal and transvaginal ultrasound examinations were performed for complete evaluation of the gestation as well as the maternal uterus, adnexal regions, and pelvic cul-de-sac. Transvaginal technique was performed to assess early pregnancy. COMPARISON:  None. FINDINGS: Intrauterine gestational sac: Single Yolk sac:  Not Visualized. Embryo:  Not Visualized. Cardiac Activity: Not Visualized. Heart Rate: n/a  bpm MSD: 5.5 mm   by w   2 d Subchorionic hemorrhage:  None visualized. Maternal uterus/adnexae: The ovaries are normal in appearance. IMPRESSION: Early  intrauterine gestational sac with no yolk sac, fetal pole, or cardiac activity yet visualized. Recommend follow-up quantitative B-HCG levels and follow-up US in 14 days to assess viability. This recommendation follows SRU consensus guidelines: Diagnostic Criteria for Nonviable Pregnancy Early in the First Trimester. [place] Med [id number]. Electronically Signed   By: Valetta Mole M.D.   On: 07/13/2021 12:15    ____________________________________________   PROCEDURES  Procedure(s) performed (including Critical Care):  Procedures   ____________________________________________   INITIAL IMPRESSION / ASSESSMENT AND PLAN / ED COURSE      Patient presents with above-stated history and exam for several days of some nonbloody nonbilious vomiting diarrhea associate with crampy abdominal pain after finding out she was pregnant.  On arrival she is afebrile and hemodynamically stable.  Her abdomen is soft nontender throughout and she has no CVA tenderness.  She does not appear significantly dehydrated on exam.  Differential includes acute infectious gastroenteritis, cystitis, hyperemesis, ectopic pregnancy versus other pregnancy related cause.  CMP shows no significant electrolyte or metabolic derangements.  CBC shows no leukocytosis or acute anemia.  Lipase not consistent with  acute pancreatitis.  No focal tenderness or Murphy sign to suggest cholecystitis.  hCG is [id number].  UA is not suggestive of urinary tract infection at this time.  OB ultrasound shows a early gestational sac with no yet visualized yolk sac fetal pole or cardiac cavity.  No other acute normalities noted.  On assessment after patient had some Tylenol and likely just she states her nausea and abdominal pain are nearly complete resolved.  She has no drink.  Suspect possible infectious gastroenteritis.  Given stable vitals with otherwise reassuring exam and work-up patient now tolerating p.o. after some antiemetics I think she is stable for discharge with close outpatient follow-up.  Discussed findings on OB ultrasound recommendation for repeat in the next 2 weeks as well as repeat hCG.  She is amenable with plan.  Will write Rx for Diclegis.  Discharged stable condition.  Strict return precautions advised and discussed.       ____________________________________________   FINAL CLINICAL IMPRESSION(S) / ED DIAGNOSES  Final diagnoses:  First trimester pregnancy  Nausea vomiting and diarrhea    Medications  doxylamine (Sleep) (UNISOM) tablet 25 mg (25 mg Oral Given 07/13/21 1301)  pyridOXINE (VITAMIN B-6) tablet 100 mg (100 mg Oral Given 07/13/21 1315)  acetaminophen (TYLENOL) tablet 1,000 mg (1,000 mg Oral Given 07/13/21 1301)     ED Discharge Orders          Ordered    Doxylamine-Pyridoxine (DICLEGIS) 10-10 MG TBEC  2 times daily PRN        07/13/21 1338             Note:  This document was prepared using Dragon voice recognition software and may include unintentional dictation errors.    Lucrezia Starch, MD 07/13/21 1341

## 2021-07-13 NOTE — ED Notes (Signed)
Pt stated she has been having cramps in lower abdomen and back; pt stated OB could not see her until October.

## 2021-07-15 NOTE — Patient Instructions (Addendum)
Common Medications Safe in Pregnancy  Acne:      Constipation:  Benzoyl Peroxide     Colace  Clindamycin      Dulcolax Suppository  Topica Erythromycin     Fibercon  Salicylic Acid      Metamucil         Miralax AVOID:        Senakot   Accutane    Cough:  Retin-A       Cough Drops  Tetracycline      Phenergan w/ Codeine if Rx  Minocycline      Robitussin (Plain & DM)  Antibiotics:     Crabs/Lice:  Ceclor       RID  Cephalosporins    AVOID:  E-Mycins      Kwell  Keflex  Macrobid/Macrodantin   Diarrhea:  Penicillin      Kao-Pectate  Zithromax      Imodium AD         PUSH FLUIDS AVOID:       Cipro     Fever:  Tetracycline      Tylenol (Regular or Extra  Minocycline       Strength)  Levaquin      Extra Strength-Do not          Exceed 8 tabs/24 hrs Caffeine:        <200mg/day (equiv. To 1 cup of coffee or  approx. 3 12 oz sodas)         Gas: Cold/Hayfever:       Gas-X  Benadryl      Mylicon  Claritin       Phazyme  **Claritin-D        Chlor-Trimeton    Headaches:  Dimetapp      ASA-Free Excedrin  Drixoral-Non-Drowsy     Cold Compress  Mucinex (Guaifenasin)     Tylenol (Regular or Extra  Sudafed/Sudafed-12 Hour     Strength)  **Sudafed PE Pseudoephedrine   Tylenol Cold & Sinus     Vicks Vapor Rub  Zyrtec  **AVOID if Problems With Blood Pressure         Heartburn: Avoid lying down for at least 1 hour after meals  Aciphex      Maalox     Rash:  Milk of Magnesia     Benadryl    Mylanta       1% Hydrocortisone Cream  Pepcid  Pepcid Complete   Sleep Aids:  Prevacid      Ambien   Prilosec       Benadryl  Rolaids       Chamomile Tea  Tums (Limit 4/day)     Unisom         Tylenol PM         Warm milk-add vanilla or  Hemorrhoids:       Sugar for taste  Anusol/Anusol H.C.  (RX: Analapram 2.5%)  Sugar Substitutes:  Hydrocortisone OTC     Ok in moderation  Preparation H      Tucks        Vaseline lotion applied to tissue with  wiping    Herpes:     Throat:  Acyclovir      Oragel  Famvir  Valtrex     Vaccines:         Flu Shot Leg Cramps:       *Gardasil  Benadryl      Hepatitis A         Hepatitis B Nasal Spray:         Pneumovax  Saline Nasal Spray     Polio Booster         Tetanus Nausea:       Tuberculosis test or PPD  Vitamin B6 25 mg TID   AVOID:    Dramamine      *Gardasil  Emetrol       Live Poliovirus  Ginger Root 250 mg QID    MMR (measles, mumps &  High Complex Carbs @ Bedtime    rebella)  Sea Bands-Accupressure    Varicella (Chickenpox)  Unisom 1/2 tab TID     *No known complications           If received before Pain:         Known pregnancy;   Darvocet       Resume series after  Lortab        Delivery  Percocet    Yeast:   Tramadol      Femstat  Tylenol 3      Gyne-lotrimin  Ultram       Monistat  Vicodin           MISC:         All Sunscreens           Hair Coloring/highlights          Insect Repellant's          (Including DEET)         Mystic Tans   Nausea and Vomiting "Morning sickness" can occur at any time during the day and generally goes away by 16 weeks of pregnancy.  Have saltine crackers or pretzels by your bed and eat a few bites before you raise your head out of the bed in the morning.  Eat small frequent meals throughout the day instead of large meals.  Drink plenty of fluids throughout the day to stay hydrated, just don't drink a lot of fluids with your meals. This can make your stomach fill up faster making you feel sick.  Do not brush your teeth right after you eat.  Products with real ginger are good for nausea, like ginger ale and ginger hard candy. Make sure it says made with real ginger!  Sucking on sour candy like lemon heads is also good for nausea.  If your prenatal vitamins make you nauseated, take them at night so you will sleep through the nausea.  Sea bands  You can try over the counter Vitamin B6 (pyridoxine) 25 mg four times a day OR 50 mg  twice a day. Add Unsiom (doxylamine) 12.5 mg (1/2 tab) in the morning, 12.67m 6-8 hours later, then 25 mg every night if symptoms do not improve with the Vitamin B6 alone.  If you feel like you need prescription medicine for nausea and vomiting please let [facility].  If you are unable to keep any fluids or food down please let [facility].  If you are vomiting more than 6 times a day please let [facility].  If you are showing any signs of dehydration-dry cracked lips, decreased urination, dizziness please let [facility].  Severe headaches that don't go away with Tylenol.  Visual changes (seeing spots, double, blurred vision)  Hope this information is helpful. If you have any problems or concerns please feel free to contact the office at [phone] 24/7 by mychart.    Reminder mychart messages are only answered during office business hours Monday-Friday 8am-5pm.   If it is an emergency please contact the office  or go the Labor and Delivery.   Thanks Wilder Amodei     Morning Sickness Morning sickness is when you feel like you may vomit (feel nauseous) during pregnancy. Sometimes, you may vomit. Morning sickness most often happens in the morning, but it can also happen at any time of the day. Some women may have morning sickness that makes them vomit all the time. This is a more serious problem that needs treatment. What are the causes? The cause of this condition is not known. What increases the risk? You had vomiting or a feeling like you may vomit before your pregnancy. You had morning sickness in another pregnancy. You are pregnant with more than one baby, such as twins. What are the signs or symptoms? Feeling like you may vomit. Vomiting. How is this treated? Treatment is usually not needed for this condition. You may only need to change what you eat. In some cases, your doctor may give you some things to take for your condition. These include: Vitamin B6 supplements. Medicines to  treat the feeling that you may vomit. Ginger. Follow these instructions at home: Medicines Take over-the-counter and prescription medicines only as told by your doctor. Do not take any medicines until you talk with your doctor about them first. Take multivitamins before you get pregnant. These can stop or lessen the symptoms of morning sickness. Eating and drinking Eat dry toast or crackers before getting out of bed. Eat 5 or 6 small meals a day. Eat dry and bland foods like rice and baked potatoes. Do not eat greasy, fatty, or spicy foods. Have someone cook for you if the smell of food causes you to vomit or to feel like you may vomit. If you feel like you may vomit after taking prenatal vitamins, take them at night or with a snack. Eat protein foods when you need a snack. Nuts, yogurt, and cheese are good choices. Drink fluids throughout the day. Try ginger ale made with real ginger, ginger tea made from fresh grated ginger, or ginger candies. General instructions Do not smoke or use any products that contain nicotine or tobacco. If you need help quitting, ask your doctor. Use an air purifier to keep the air in your house free of smells. Get lots of fresh air. Try to avoid smells that make you feel sick. Try wearing an acupressure wristband. This is a wristband that is used to treat seasickness. Try a treatment called acupuncture. In this treatment, a doctor puts needles into certain areas of your body to make you feel better. Contact a doctor if: You need medicine to feel better. You feel dizzy or light-headed. You are losing weight. Get help right away if: The feeling that you may vomit will not go away, or you cannot stop vomiting. You faint. You have very bad pain in your belly. Summary Morning sickness is when you feel like you may vomit (feel nauseous) during pregnancy. You may feel sick in the morning, but you can feel this way at any time of the day. Making some changes to  what you eat may help your symptoms go away. This information is not intended to replace advice given to you by your health care provider. Make sure you discuss any questions you have with your health care provider. Document Revised: 05/27/2020 Document Reviewed: 05/06/2020 Elsevier Patient Education  2022 [employer].

## 2021-07-16 ENCOUNTER — Encounter: Payer: Self-pay | Admitting: Obstetrics and Gynecology

## 2021-07-16 ENCOUNTER — Ambulatory Visit (INDEPENDENT_AMBULATORY_CARE_PROVIDER_SITE_OTHER): Payer: Self-pay | Admitting: Obstetrics and Gynecology

## 2021-07-16 ENCOUNTER — Other Ambulatory Visit: Payer: Self-pay

## 2021-07-16 VITALS — BP 127/84 | HR 108 | Ht 64.0 in | Wt 178.5 lb

## 2021-07-16 DIAGNOSIS — N926 Irregular menstruation, unspecified: Secondary | ICD-10-CM

## 2021-07-16 DIAGNOSIS — Z3200 Encounter for pregnancy test, result unknown: Secondary | ICD-10-CM

## 2021-07-16 DIAGNOSIS — O209 Hemorrhage in early pregnancy, unspecified: Secondary | ICD-10-CM

## 2021-07-16 NOTE — Progress Notes (Signed)
Pt is present today for confirmation of pregnancy. Pt LMP 06/03/2021. Pt was confirmed at [facility] and Endocrinologist. Pt had ultrasound done at [facility] on 07/13/2021 due to going to ED for left abd pain. Results of ultrasound on 07/13/2021 was yolk sac not visualized. Pt has hx of PCOS and her last cycle lasted for 44 days.

## 2021-07-16 NOTE — Progress Notes (Addendum)
HPI:      [name] is a 20 y.o. G1P0 who LMP was Patient's last menstrual period was 06/03/2021 (exact date).  Subjective:   She presents today for follow-up after ED visit.  She was seen 3 days ago for pelvic pain and diagnosed with a positive pregnancy test.  Her ultrasound at that time showed an empty sac in the uterus but was likely too early to show a crown-rump length.  Since that time the patient has not had any significant problems.  With the last few days she has noted some pink discharge but no actual bleeding.  She has breast tenderness and occasionally feels bloated but otherwise no significant symptoms. She has a history of PCO and her dating is unknown.    Hx: The following portions of the patient's history were reviewed and updated as appropriate:             She  has a past medical history of PCOS (polycystic ovarian syndrome), Prediabetes, Prolactinoma ([place]), and Vitamin D deficiency. She does not have a problem list on file. She  has a past surgical history that includes Hydradenitis excision. Her family history includes Anemia in her mother; Healthy in her father and maternal grandfather; Hypertension in her maternal grandmother. She  reports that she has never smoked. She has never used smokeless tobacco. She reports that she does not currently use alcohol. She reports that she does not use drugs. She has a current medication list which includes the following prescription(s): vitamin d3, doxylamine-pyridoxine, metformin, and prenatal vit-fe fumarate-fa. She has No Known Allergies.       Review of Systems:  Review of Systems  Constitutional: Denied constitutional symptoms, night sweats, recent illness, fatigue, fever, insomnia and weight loss.  Eyes: Denied eye symptoms, eye pain, photophobia, vision change and visual disturbance.  Ears/Nose/Throat/Neck: Denied ear, nose, throat or neck symptoms, hearing loss, nasal discharge, sinus congestion and sore throat.   Cardiovascular: Denied cardiovascular symptoms, arrhythmia, chest pain/pressure, edema, exercise intolerance, orthopnea and palpitations.  Respiratory: Denied pulmonary symptoms, asthma, pleuritic pain, productive sputum, cough, dyspnea and wheezing.  Gastrointestinal: Denied, gastro-esophageal reflux, melena, nausea and vomiting.  Genitourinary: Denied genitourinary symptoms including symptomatic vaginal discharge, pelvic relaxation issues, and urinary complaints.  Musculoskeletal: Denied musculoskeletal symptoms, stiffness, swelling, muscle weakness and myalgia.  Dermatologic: Denied dermatology symptoms, rash and scar.  Neurologic: Denied neurology symptoms, dizziness, headache, neck pain and syncope.  Psychiatric: Denied psychiatric symptoms, anxiety and depression.  Endocrine: Denied endocrine symptoms including hot flashes and night sweats.   Meds:   Current Outpatient Medications on File Prior to Visit  Medication Sig Dispense Refill   Cholecalciferol (VITAMIN D3) 125 MCG (5000 UT) CAPS Take by mouth. One tablet daily     Doxylamine-Pyridoxine (DICLEGIS) 10-10 MG TBEC Take 1 tablet by mouth 2 (two) times daily as needed. 60 tablet 0   metFORMIN (GLUCOPHAGE) 500 MG tablet Take by mouth 2 (two) times daily with a meal.     Prenatal Vit-Fe Fumarate-FA (PRENATAL VITAMINS PO) Take by mouth.     No current facility-administered medications on file prior to visit.      Objective:     Vitals:   07/16/21 1113  BP: 127/84  Pulse: (!) 108   Filed Weights   07/16/21 1113  Weight: 178 lb 8 oz (81 kg)                        Assessment:  G1P0 There are no problems to display for this patient.    1. Possible pregnancy, not confirmed   2. Vaginal bleeding in pregnancy, first trimester        Plan:            Prenatal Plan 1.  The patient was given prenatal literature. 2.  She was continued on prenatal vitamins. 3.  A prenatal lab panel to be drawn at nurse  visit. 4.  An ultrasound was ordered to better determine an EDC and to follow-up an empty sac noted on previous ultrasound. 5.  A nurse visit was scheduled. 6.  Genetic testing and testing for other inheritable conditions discussed in detail. She will decide in the future whether to have these labs performed. 7.  A general overview of pregnancy testing, visit schedule, ultrasound schedule, and prenatal care was discussed. 8.  COVID and its risks associated with pregnancy, prevention by limiting exposure and use of masks, as well as the risks and benefits of vaccination during pregnancy were discussed in detail.  [facility] policy regarding office and hospital visitation and testing was explained. 9.  Benefits of breast-feeding discussed in detail including both maternal and infant benefits. Ready Set Baby website discussed.  Orders Orders Placed This Encounter  Procedures   US OB Comp Less 14 Wks    No orders of the defined types were placed in this encounter.     F/U  Return in about 3 weeks (around 08/06/2021). I spent 31 minutes involved in the care of this patient preparing to see the patient by obtaining and reviewing her medical history (including labs, imaging tests and prior procedures), documenting clinical information in the electronic health record (EHR), counseling and coordinating care plans, writing and sending prescriptions, ordering tests or procedures and in direct communicating with the patient and medical staff discussing pertinent items from her history and physical exam.  Finis Bud, M.D. 07/16/2021 11:44 AM

## 2021-07-18 ENCOUNTER — Telehealth: Payer: Self-pay | Admitting: Obstetrics and Gynecology

## 2021-07-18 ENCOUNTER — Other Ambulatory Visit: Payer: Self-pay

## 2021-07-18 ENCOUNTER — Emergency Department: Payer: Self-pay

## 2021-07-18 ENCOUNTER — Emergency Department
Admission: EM | Admit: 2021-07-18 | Discharge: 2021-07-18 | Disposition: A | Discharge status: Home / Self Care | Payer: Self-pay | Attending: Emergency Medicine | Admitting: Emergency Medicine

## 2021-07-18 DIAGNOSIS — O4690 Antepartum hemorrhage, unspecified, unspecified trimester: Secondary | ICD-10-CM

## 2021-07-18 DIAGNOSIS — N898 Other specified noninflammatory disorders of vagina: Secondary | ICD-10-CM | POA: Insufficient documentation

## 2021-07-18 DIAGNOSIS — Z3A01 Less than 8 weeks gestation of pregnancy: Secondary | ICD-10-CM | POA: Insufficient documentation

## 2021-07-18 DIAGNOSIS — R1012 Left upper quadrant pain: Secondary | ICD-10-CM | POA: Insufficient documentation

## 2021-07-18 DIAGNOSIS — R1030 Lower abdominal pain, unspecified: Secondary | ICD-10-CM

## 2021-07-18 DIAGNOSIS — O209 Hemorrhage in early pregnancy, unspecified: Secondary | ICD-10-CM | POA: Insufficient documentation

## 2021-07-18 LAB — COMPREHENSIVE METABOLIC PANEL
ALT: 52 U/L — ABNORMAL HIGH (ref 0–44)
AST: 33 U/L (ref 15–41)
Albumin: 4.5 g/dL (ref 3.5–5.0)
Alkaline Phosphatase: 48 U/L (ref 38–126)
Anion gap: 11 (ref 5–15)
BUN: 8 mg/dL (ref 6–20)
CO2: 26 mmol/L (ref 22–32)
Calcium: 9.8 mg/dL (ref 8.9–10.3)
Chloride: 100 mmol/L (ref 98–111)
Creatinine, Ser: 0.63 mg/dL (ref 0.44–1.00)
GFR, Estimated: >60 mL/min (ref >60)
Glucose, Bld: 97 mg/dL (ref 70–99)
Potassium: 3.6 mmol/L (ref 3.5–5.1)
Sodium: 137 mmol/L (ref 135–145)
Total Bilirubin: 0.9 mg/dL (ref 0.3–1.2)
Total Protein: 7.9 g/dL (ref 6.5–8.1)

## 2021-07-18 LAB — URINALYSIS, COMPLETE (UACMP) WITH MICROSCOPIC
Bacteria, UA: NONE SEEN
Bilirubin Urine: NEGATIVE
Glucose, UA: NEGATIVE mg/dL
Hgb urine dipstick: NEGATIVE
Ketones, ur: NEGATIVE mg/dL
Leukocytes,Ua: NEGATIVE
Nitrite: NEGATIVE
Protein, ur: NEGATIVE mg/dL
Specific Gravity, Urine: 1.018 (ref 1.005–1.030)
pH: 7 (ref 5.0–8.0)

## 2021-07-18 LAB — CBC
HCT: 42 % (ref 36.0–46.0)
Hemoglobin: 14.1 g/dL (ref 12.0–15.0)
MCH: 27.7 pg (ref 26.0–34.0)
MCHC: 33.6 g/dL (ref 30.0–36.0)
MCV: 82.5 fL (ref 80.0–100.0)
Platelets: 170 10*3/uL (ref 150–400)
RBC: 5.09 MIL/uL (ref 3.87–5.11)
RDW: 13.2 % (ref 11.5–15.5)
WBC: 5.9 10*3/uL (ref 4.0–10.5)
nRBC: 0 % (ref 0.0–0.2)

## 2021-07-18 LAB — HCG, QUANTITATIVE, PREGNANCY: hCG, Beta Chain, Quant, S: 10566 m[IU]/mL — ABNORMAL HIGH (ref <5)

## 2021-07-18 LAB — ABO/RH: ABO/RH(D): O POS

## 2021-07-18 LAB — LIPASE, BLOOD: Lipase: 42 U/L (ref 11–51)

## 2021-07-18 NOTE — Telephone Encounter (Signed)
Called patient  and told her to go to ED for evaluation. Because of increased pain and bleeding. She verbalized understanding.

## 2021-07-18 NOTE — Telephone Encounter (Signed)
Annette Horn called in and states when she saw Dr.Evans on 9/21 that she was spotting and Dr. Amalia Hailey told her that is normal.  Annette Horn states on Tuesday the spotting has increased and is now red and at times pink.  Annette Horn states she is now having some left side pain.  Patient is concerned and would like a call back to see what is going on.

## 2021-07-18 NOTE — ED Triage Notes (Signed)
Pt reports recent pregnancy diagnosis, pt reports left upper abd pain for the past 3 days, pt states that she has been spotting since Tuesday, pt states that she had an u/s on Sunday and told only a sac was seen and that either the pregnancy is early or that she may have an ectopic.

## 2021-07-18 NOTE — ED Provider Notes (Signed)
[facility]-EMERGENCY DEPARTMENT  ____________________________________________  Time seen: Approximately 7:56 PM  I have reviewed the triage vital signs and the nursing notes.   HISTORY  Chief Complaint Vaginal Bleeding and Abdominal Pain   Historian Patient     HPI Annette Horn is a 20 y.o. female presents to the emergency department with pink vaginal spotting that is occurred intermittently for the past 3 days in association with some intermittent left upper quadrant abdominal pain.  Patient reports that vaginal bleeding is not heavy enough to be noticed in her underwear and can only be seen with wiping.  She denies pelvic pain or current abdominal pain.  No nausea, vomiting or upper back pain.  Patient reports that she is approximately 5.[redacted] weeks pregnant.  She was seen and evaluated on 07/13/2021 and had labs with dedicated ultrasound which could not exclude ectopic pregnancy at that time.  Presents to the emergency department to have repeat beta-hCG and possible ultrasound.  Past Medical History:  Diagnosis Date   PCOS (polycystic ovarian syndrome)    Prediabetes    Prolactinoma ([place])    Vitamin D deficiency      Immunizations up to date:  Yes.     Past Medical History:  Diagnosis Date   PCOS (polycystic ovarian syndrome)    Prediabetes    Prolactinoma ([facility])    Vitamin D deficiency     There are no problems to display for this patient.   Past Surgical History:  Procedure Laterality Date   HYDRADENITIS EXCISION      Prior to Admission medications   Medication Sig Start Date End Date Taking? Authorizing Provider  Cholecalciferol (VITAMIN D3) 125 MCG (5000 UT) CAPS Take by mouth. One tablet daily    [provider]  Doxylamine-Pyridoxine (DICLEGIS) 10-10 MG TBEC Take 1 tablet by mouth 2 (two) times daily as needed. 07/13/21   Lucrezia Starch, MD  metFORMIN (GLUCOPHAGE) 500 MG tablet Take by mouth 2 (two) times daily with a meal.    [provider]  Prenatal Vit-Fe Fumarate-FA (PRENATAL VITAMINS PO) Take by mouth.    [provider]    Allergies Patient has no known allergies.  Family History  Problem Relation Age of Onset   Anemia Mother    Healthy Father    Hypertension Maternal Grandmother    Healthy Maternal Grandfather     Social History Social History   Tobacco Use   Smoking status: Never   Smokeless tobacco: Never  Vaping Use   Vaping Use: Former  Substance Use Topics   Alcohol use: Not Currently    Comment: Rare   Drug use: Never     Review of Systems  Constitutional: No fever/chills Eyes:  No discharge ENT: No upper respiratory complaints. Respiratory: no cough. No SOB/ use of accessory muscles to breath Gastrointestinal:   No nausea, no vomiting.  No diarrhea.  No constipation. Genitourinary: Patient has vaginal bleeding.  Musculoskeletal: Negative for musculoskeletal pain. Skin: Negative for rash, abrasions, lacerations, ecchymosis.    ____________________________________________   PHYSICAL EXAM:  VITAL SIGNS: ED Triage Vitals  Enc Vitals Group     BP 07/18/21 1516 (!) 169/80     Pulse Rate 07/18/21 1516 89     Resp 07/18/21 1516 18     Temp 07/18/21 1516 98.5 F (36.9 C)     Temp Source 07/18/21 1516 Oral     SpO2 07/18/21 1516 99 %     Weight 07/18/21 1517 178 lb 5.6 oz (80.9  kg)     Height 07/18/21 1517 5\' 4"  (1.626 m)     Head Circumference --      Peak Flow --      Pain Score 07/18/21 1516 9     Pain Loc --      Pain Edu? --      Excl. in [place]? --      Constitutional: Alert and oriented. Well appearing and in no acute distress. Eyes: Conjunctivae are normal. PERRL. EOMI. Head: Atraumatic. ENT:      Nose: No congestion/rhinnorhea.      Mouth/Throat: Mucous membranes are moist.  Neck: No stridor.  No cervical spine tenderness to palpation.  Cardiovascular: Normal rate, regular rhythm. Normal S1 and S2.  Good peripheral circulation. Respiratory:  Normal respiratory effort without tachypnea or retractions. Lungs CTAB. Good air entry to the bases with no decreased or absent breath sounds Gastrointestinal: Bowel sounds x 4 quadrants. Soft and nontender to palpation. No guarding or rigidity. No distention. Musculoskeletal: Full range of motion to all extremities. No obvious deformities noted Neurologic:  Normal for age. No gross focal neurologic deficits are appreciated.  Skin:  Skin is warm, dry and intact. No rash noted. Psychiatric: Mood and affect are normal for age. Speech and behavior are normal.   ____________________________________________   LABS (all labs ordered are listed, but only abnormal results are displayed)  Labs Reviewed  COMPREHENSIVE METABOLIC PANEL - Abnormal; Notable for the following components:      Result Value   ALT 52 (*)    All other components within normal limits  URINALYSIS, COMPLETE (UACMP) WITH MICROSCOPIC - Abnormal; Notable for the following components:   Color, Urine YELLOW (*)    APPearance CLEAR (*)    All other components within normal limits  HCG, QUANTITATIVE, PREGNANCY - Abnormal; Notable for the following components:   hCG, Beta Chain, Quant, S 10,566 (*)    All other components within normal limits  LIPASE, BLOOD  CBC  ABO/RH   ____________________________________________  EKG   ____________________________________________  RADIOLOGY Unk Pinto, personally viewed and evaluated these images (plain radiographs) as part of my medical decision making, as well as reviewing the written report by the radiologist.  US OB LESS THAN 14 WEEKS WITH OB TRANSVAGINAL  Result Date: 07/18/2021 CLINICAL DATA:  Lower abdominal pain EXAM: OBSTETRIC <14 WK [place] AND TRANSVAGINAL OB US TECHNIQUE: Both transabdominal and transvaginal ultrasound examinations were performed for complete evaluation of the gestation as well as the maternal uterus, adnexal regions, and pelvic cul-de-sac. Transvaginal  technique was performed to assess early pregnancy. COMPARISON:  07/13/2021 FINDINGS: Intrauterine gestational sac: Single Yolk sac:  Visualized Embryo:  Questionably visualized MSD: 8.4 mm   5 w   4 d Subchorionic hemorrhage:  None visualized. Maternal uterus/adnexae: Ovaries are within normal limits. Left ovary measures 2.7 by 2.5 x 1.7 cm. The right ovary measures 2.9 x 2.7 x 2.8 cm. No significant free fluid. IMPRESSION: Single intrauterine pregnancy with visible gestational sac and yolk sac, questionable embryo. Suggest short interval follow-up in 10-14 days to confirm viability. Otherwise no specific abnormality is seen Electronically Signed   By: Donavan Foil M.D.   On: 07/18/2021 17:46    ____________________________________________    PROCEDURES  Procedure(s) performed:     Procedures     Medications - No data to display   ____________________________________________   INITIAL IMPRESSION / ASSESSMENT AND PLAN / ED COURSE  Pertinent labs & imaging results that were available during my  care of the patient were reviewed by me and considered in my medical decision making (see chart for details).  Clinical Course as of 07/18/21 1956  Fri Jul 18, 2021  1714 HCG, Beta Chain, Laqueta Carina(!): 10,566 [JW]  1719 nRBC: 0.0 [JW]    Clinical Course User Index [JW] Lannie Fields, PA-C      Assessment and plan Vaginal bleeding in pregnancy 20 year old female presents to the emergency department with light vaginal bleeding for the past 2 to 3 days as well as left upper quadrant abdominal discomfort that is episodic in nature.  Patient was hypertensive at triage but upon blood pressure recheck, patient's blood pressure was within range at 124/84.  Beta-hCG has increased appropriately from prior beta-hCG and basic labs are reassuring.  No evidence of bacteria in urinalysis.  Patient's blood type is a positive.  Dedicated OB ultrasound showed single intrauterine pregnancy without  evidence of ectopic.  Patient has a follow-up appointment with her OB/GYN next week.  Advised her to keep appointment for repeat ultrasound to confirm viability.  Patient feels comfortable managing discomfort at home with Tylenol.   ____________________________________________  FINAL CLINICAL IMPRESSION(S) / ED DIAGNOSES  Final diagnoses:  Vaginal bleeding in pregnancy      NEW MEDICATIONS STARTED DURING THIS VISIT:  ED Discharge Orders     None           This chart was dictated using voice recognition software/Dragon. Despite best efforts to proofread, errors can occur which can change the meaning. Any change was purely unintentional.     Karren Cobble 07/18/21 2006    Vladimir Crofts, MD 08/04/21 [phone]

## 2021-07-21 ENCOUNTER — Other Ambulatory Visit: Payer: Self-pay

## 2021-07-21 ENCOUNTER — Telehealth: Payer: Self-pay

## 2021-07-21 DIAGNOSIS — O209 Hemorrhage in early pregnancy, unspecified: Secondary | ICD-10-CM

## 2021-07-21 NOTE — Telephone Encounter (Signed)
Patient called in this morning to report increased bleeding and clots. She was evaluated on Friday at ED. Beta Quant obtained and it was 10,566. Ultrasound was also done and it revealed:  IMPRESSION: Single intrauterine pregnancy with visible gestational sac and yolk sac, questionable embryo. Suggest short interval follow-up in 10-14 days to confirm viability. Otherwise no specific abnormality is seen    Asked her to come in and have another beta quant done to see if her levels has changed within the last 48 hours per Deneise Lever. Please advise.

## 2021-07-22 ENCOUNTER — Inpatient Hospital Stay (HOSPITAL_COMMUNITY)
Admission: AD | Admit: 2021-07-22 | Discharge: 2021-07-22 | Disposition: A | Discharge status: Home / Self Care | Payer: Self-pay | Attending: Obstetrics & Gynecology | Admitting: Obstetrics & Gynecology

## 2021-07-22 ENCOUNTER — Telehealth: Payer: Self-pay | Admitting: Obstetrics and Gynecology

## 2021-07-22 ENCOUNTER — Encounter (HOSPITAL_COMMUNITY): Payer: Self-pay | Admitting: Obstetrics & Gynecology

## 2021-07-22 ENCOUNTER — Other Ambulatory Visit: Payer: Self-pay

## 2021-07-22 DIAGNOSIS — Z7984 Long term (current) use of oral hypoglycemic drugs: Secondary | ICD-10-CM | POA: Insufficient documentation

## 2021-07-22 DIAGNOSIS — Z3A01 Less than 8 weeks gestation of pregnancy: Secondary | ICD-10-CM | POA: Insufficient documentation

## 2021-07-22 DIAGNOSIS — Z711 Person with feared health complaint in whom no diagnosis is made: Secondary | ICD-10-CM | POA: Insufficient documentation

## 2021-07-22 HISTORY — DX: Disorder of thyroid, unspecified: E07.9

## 2021-07-22 LAB — WET PREP, GENITAL
Clue Cells Wet Prep HPF POC: NONE SEEN
Sperm: NONE SEEN
Trich, Wet Prep: NONE SEEN
Yeast Wet Prep HPF POC: NONE SEEN

## 2021-07-22 LAB — BETA HCG QUANT (REF LAB): hCG Quant: 14424 m[IU]/mL

## 2021-07-22 LAB — URINALYSIS, ROUTINE W REFLEX MICROSCOPIC
Bilirubin Urine: NEGATIVE
Glucose, UA: NEGATIVE mg/dL
Hgb urine dipstick: NEGATIVE
Ketones, ur: NEGATIVE mg/dL
Leukocytes,Ua: NEGATIVE
Nitrite: NEGATIVE
Protein, ur: NEGATIVE mg/dL
Specific Gravity, Urine: 1.025 (ref 1.005–1.030)
pH: 7 (ref 5.0–8.0)

## 2021-07-22 NOTE — Telephone Encounter (Signed)
Patient called requesting a lab order be placed for progesterone- pt stated did labs yesterday. Please Advise.

## 2021-07-22 NOTE — MAU Provider Note (Signed)
History     CSN: [id number]  Arrival date and time: 07/22/21 1156   Event Date/Time   First Provider Initiated Contact with Patient 07/22/21 1234      Chief Complaint  Patient presents with   Vaginal Bleeding   HPI Annette Horn is a 20 y.o. G1P0 at [redacted]w[redacted]d who presents with brown discharge. She reports it started 2 weeks ago and she has been seen in the ED twice since then. She most recently had an ultrasound on 9/23 which showed a gestational sac and yolk sac. She reports after the ultrasound, she started having an increase in vaginal bleeding. Today she reports she is not having to use a pad and only seeing occasional spotting when she wipes. She has an appointment on 10/3 for a repeat ultrasound. She denies any pain.  OB History     Gravida  1   Para      Term      Preterm      AB      Living         SAB      IAB      Ectopic      Multiple      Live Births              Past Medical History:  Diagnosis Date   PCOS (polycystic ovarian syndrome)    Prediabetes    Prolactinoma ([facility])    Thyroid condition    Vitamin D deficiency     Past Surgical History:  Procedure Laterality Date   HYDRADENITIS EXCISION      Family History  Problem Relation Age of Onset   Anemia Mother    Healthy Father    Hypertension Maternal Grandmother    Healthy Maternal Grandfather     Social History   Tobacco Use   Smoking status: Never   Smokeless tobacco: Never  Vaping Use   Vaping Use: Former  Substance Use Topics   Alcohol use: Not Currently    Comment: Rare   Drug use: Never    Allergies: No Known Allergies  Medications Prior to Admission  Medication Sig Dispense Refill Last Dose   Cholecalciferol (VITAMIN D3) 125 MCG (5000 UT) CAPS Take by mouth. One tablet daily   07/22/2021   metFORMIN (GLUCOPHAGE) 500 MG tablet Take by mouth 2 (two) times daily with a meal.   07/21/2021   Prenatal Vit-Fe Fumarate-FA (PRENATAL VITAMINS PO) Take by mouth.    07/22/2021   Doxylamine-Pyridoxine (DICLEGIS) 10-10 MG TBEC Take 1 tablet by mouth 2 (two) times daily as needed. 60 tablet 0     Review of Systems  Constitutional: Negative.  Negative for fatigue and fever.  HENT: Negative.    Respiratory: Negative.  Negative for shortness of breath.   Cardiovascular: Negative.  Negative for chest pain.  Gastrointestinal: Negative.  Negative for abdominal pain, constipation, diarrhea, nausea and vomiting.  Genitourinary:  Positive for vaginal discharge. Negative for dysuria and vaginal bleeding.  Neurological: Negative.  Negative for dizziness and headaches.  Physical Exam   Blood pressure 131/84, pulse 96, temperature 98.2 F (36.8 C), temperature source Oral, resp. rate 20, height 5\' 4"  (1.626 m), weight 81.2 kg, last menstrual period 06/03/2021, SpO2 100 %.  Physical Exam Vitals and nursing note reviewed.  Constitutional:      General: She is not in acute distress.    Appearance: She is well-developed.  HENT:     Head: Normocephalic.  Eyes:  Pupils: Pupils are equal, round, and reactive to light.  Cardiovascular:     Rate and Rhythm: Normal rate and regular rhythm.     Heart sounds: Normal heart sounds.  Pulmonary:     Effort: Pulmonary effort is normal. No respiratory distress.     Breath sounds: Normal breath sounds.  Abdominal:     General: Bowel sounds are normal. There is no distension.     Palpations: Abdomen is soft.     Tenderness: There is no abdominal tenderness.  Skin:    General: Skin is warm and dry.  Neurological:     Mental Status: She is alert and oriented to person, place, and time.  Psychiatric:        Mood and Affect: Mood normal.        Behavior: Behavior normal.        Thought Content: Thought content normal.        Judgment: Judgment normal.    MAU Course  Procedures Results for orders placed or performed during the hospital encounter of 07/22/21 (from the past 24 hour(s))  Urinalysis, Routine w reflex  microscopic Urine, Clean Catch     Status: Abnormal   Collection Time: 07/22/21 12:26 PM  Result Value Ref Range   Color, Urine YELLOW YELLOW   APPearance HAZY (A) CLEAR   Specific Gravity, Urine 1.025 1.005 - 1.030   pH 7.0 5.0 - 8.0   Glucose, UA NEGATIVE NEGATIVE mg/dL   Hgb urine dipstick NEGATIVE NEGATIVE   Bilirubin Urine NEGATIVE NEGATIVE   Ketones, ur NEGATIVE NEGATIVE mg/dL   Protein, ur NEGATIVE NEGATIVE mg/dL   Nitrite NEGATIVE NEGATIVE   Leukocytes,Ua NEGATIVE NEGATIVE  Wet prep, genital     Status: Abnormal   Collection Time: 07/22/21 12:46 PM   Specimen: PATH Cytology Cervicovaginal Ancillary Only  Result Value Ref Range   Yeast Wet Prep HPF POC NONE SEEN NONE SEEN   Trich, Wet Prep NONE SEEN NONE SEEN   Clue Cells Wet Prep HPF POC NONE SEEN NONE SEEN   WBC, Wet Prep HPF POC MANY (A) NONE SEEN   Sperm NONE SEEN     MDM UA Reviewed results of previous multiple visits. Last HCG was yesterday and appropriate.  Recommended vaginal swabs to complete the evaluation. Discussed the appropriate time frame for follow up and reassurance provided. Patient desires progesterone level to be drawn today.   Assessment and Plan   1. Physically well but worried   2. [redacted] weeks gestation of pregnancy    -Discharge home in stable condition -First trimester precautions discussed -Patient advised to follow-up with OB as scheduled for prenatal care -Patient may return to MAU as needed or if her condition were to change or worsen   [name] CNM 07/22/2021, 12:34 PM

## 2021-07-22 NOTE — Progress Notes (Signed)
Pt self swabbed for GC/Chlamydia cultures.

## 2021-07-22 NOTE — Discharge Instructions (Signed)

## 2021-07-22 NOTE — MAU Note (Signed)
Presents c/o VB that began 1 week ago, reports VB initially was light spotting, was heavy x1 day then returned to spotting, currently it's spotting with wiping.  Denies current cramping, states occasional intermittent cramping, but not as strong as menstrual cramps.

## 2021-07-22 NOTE — Telephone Encounter (Signed)
Called and spoke with patient. She does not need progesterone just u/s per Evans.

## 2021-07-22 NOTE — Progress Notes (Signed)
Pt left prior to discharge instructions being reviewed and given. 

## 2021-07-23 LAB — GC/CHLAMYDIA PROBE AMP (~~LOC~~) NOT AT ARMC
Chlamydia: NEGATIVE
Comment: NEGATIVE
Comment: NORMAL
Neisseria Gonorrhea: NEGATIVE

## 2021-07-23 LAB — PROGESTERONE: Progesterone: 10.2 ng/mL

## 2021-07-23 NOTE — Telephone Encounter (Signed)
Patient is already aware.

## 2021-07-28 ENCOUNTER — Other Ambulatory Visit: Payer: Self-pay | Admitting: Obstetrics and Gynecology

## 2021-07-28 ENCOUNTER — Ambulatory Visit (INDEPENDENT_AMBULATORY_CARE_PROVIDER_SITE_OTHER): Payer: Self-pay

## 2021-07-28 ENCOUNTER — Other Ambulatory Visit: Payer: Self-pay

## 2021-07-28 DIAGNOSIS — Z3200 Encounter for pregnancy test, result unknown: Secondary | ICD-10-CM

## 2021-08-13 ENCOUNTER — Encounter: Payer: 59 | Admitting: Certified Nurse Midwife

## 2021-08-14 ENCOUNTER — Encounter: Payer: Self-pay | Admitting: Obstetrics and Gynecology

## 2021-08-25 ENCOUNTER — Encounter: Payer: Self-pay | Admitting: Certified Nurse Midwife

## 2021-08-25 ENCOUNTER — Ambulatory Visit: Payer: Self-pay | Admitting: Obstetrics and Gynecology

## 2021-08-25 NOTE — Progress Notes (Deleted)
Annette Horn presents for NOB nurse interview visit. Pregnancy confirmation done ______.  G- .  P-    . Pregnancy education material explained and given. ___ cats in the home. NOB labs ordered. (TSH/HbgA1c due to Increased BMI. There is no height or weight on file to calculate BMI. (sickle cell). HIV labs and Drug screen were explained optional and she did not decline. Drug screen ordered/declined. PNV encouraged. Genetic screening options discussed. Genetic testing: Ordered/Declined/Unsure.  Pt may discuss with provider.  Financial policy reviewed. FMLA from reviewed and signed. Pt. To follow up with provider in __ weeks for NOB physical.  All questions answered.

## 2021-08-27 ENCOUNTER — Encounter: Payer: Self-pay | Admitting: Obstetrics and Gynecology

## 2021-08-29 ENCOUNTER — Encounter: Payer: Self-pay | Admitting: Certified Nurse Midwife

## 2021-09-02 NOTE — Progress Notes (Unsigned)
Error

## 2021-09-11 LAB — TYPE AND SCREEN CARE EVERYWHERE
ANTIBODY SCREEN CARE EVERYWHERE: NEGATIVE
RH TYPE CARE EVERYWHERE: POSITIVE

## 2021-09-11 LAB — HEMOGLOBIN A1C CARE EVERYWHERE
HEMOGLOBIN A1C CARE EVERYWHERE: 5.4 % (ref 4.3–5.6)
MEAN BLOOD GLUCOSE CARE EVERYWHERE: 108 mg/dL

## 2021-10-01 LAB — TYPE AND SCREEN CARE EVERYWHERE
ANTIBODY SCREEN CARE EVERYWHERE: NEGATIVE
RH TYPE CARE EVERYWHERE: POSITIVE

## 2021-10-01 LAB — COVID-19 ANTIGEN CARE EVERYWHERE: COVID-19 ANTIGEN CARE EVERWHERE: NEGATIVE

## 2022-02-24 LAB — RESPIRATORY PANEL EXTENDED CARE EVERYWHERE: COVID-19 CARE EVERYWHERE: NEGATIVE

## 2022-05-04 LAB — HEMOGLOBIN A1C CARE EVERYWHERE
HEMOGLOBIN A1C CARE EVERYWHERE: 5.8 % — ABNORMAL HIGH (ref 4.3–5.6)
MEAN BLOOD GLUCOSE CARE EVERYWHERE: 120 mg/dL

## 2022-05-24 IMAGING — US US OB COMP LESS 14 WK
1 series · 15 of 28 positions shown · non-contrast
Comparison: None.

CLINICAL DATA: Abdominal pain

EXAM:
OBSTETRIC <14 WK US AND TRANSVAGINAL OB US
TECHNIQUE: Both transabdominal and transvaginal ultrasound examinations were
performed for complete evaluation of the gestation as well as the
maternal uterus, adnexal regions, and pelvic cul-de-sac.
Transvaginal technique was performed to assess early pregnancy.

[Series 1: us ob comp less 14 wks · 15 of 58 slices shown]
[im 1/58]
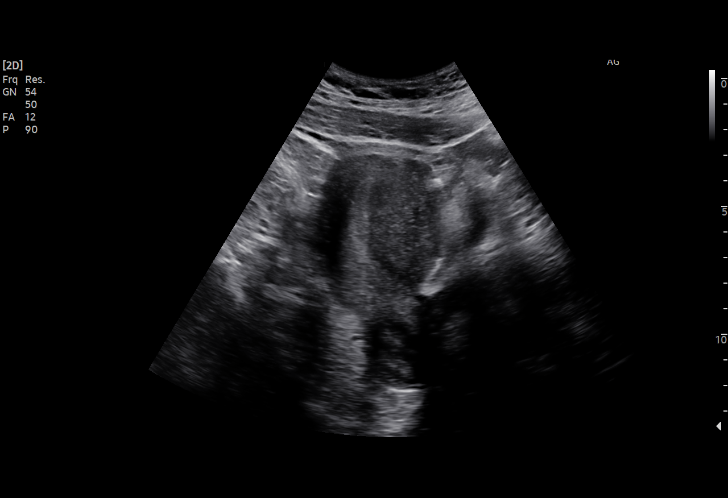
[im 5/58]
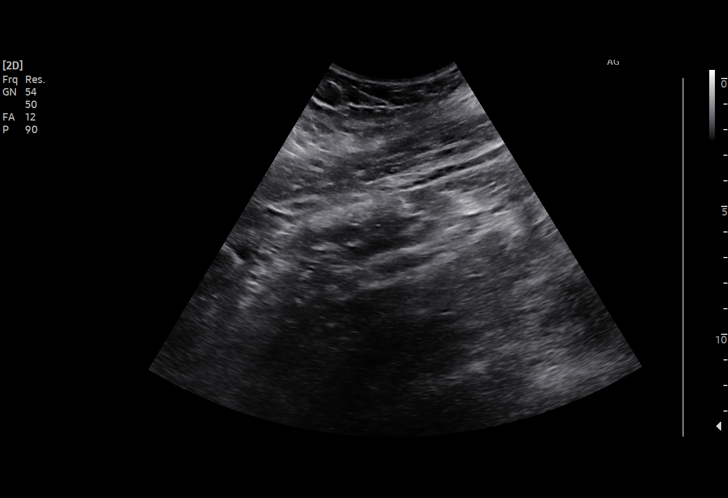
[im 9/58]
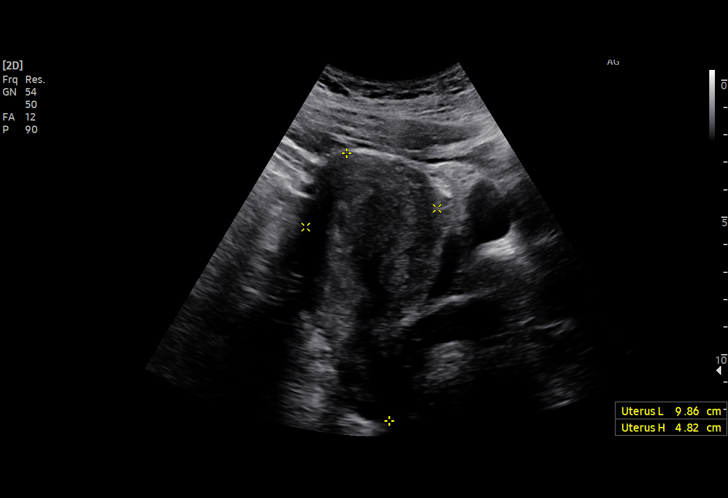
[im 13/58]
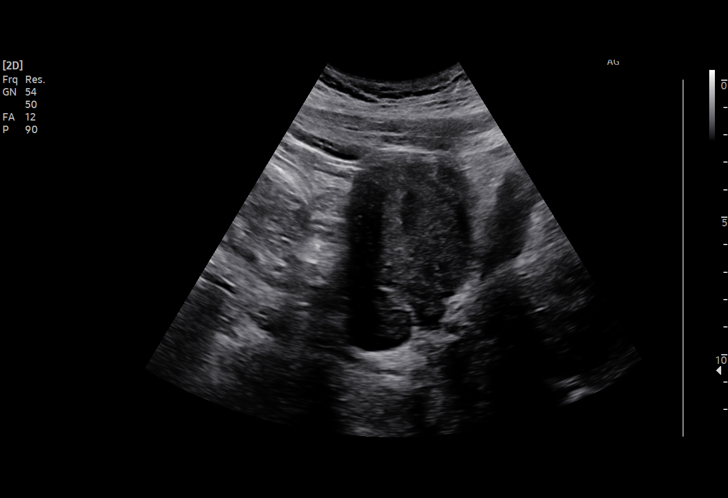
[im 17/58]
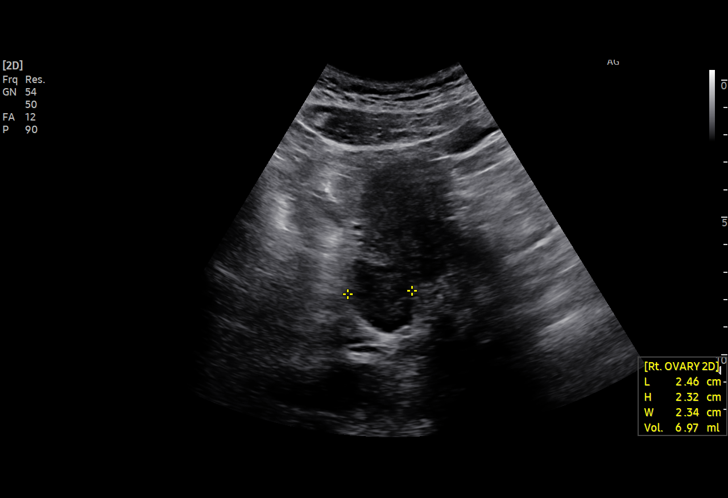
[im 22/58]
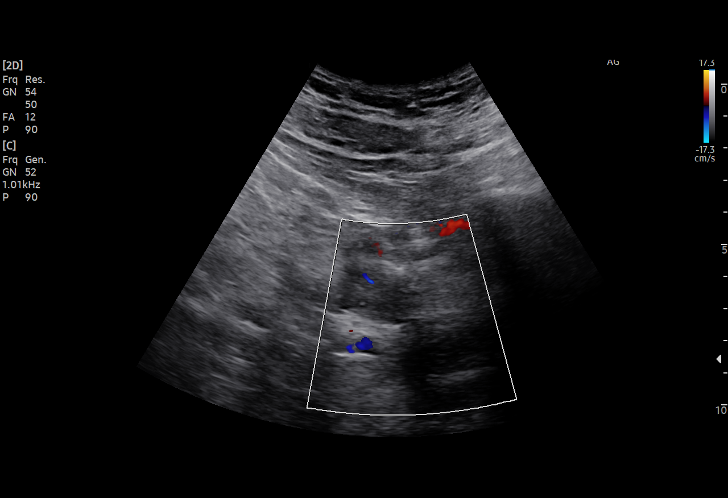
[im 26/58]
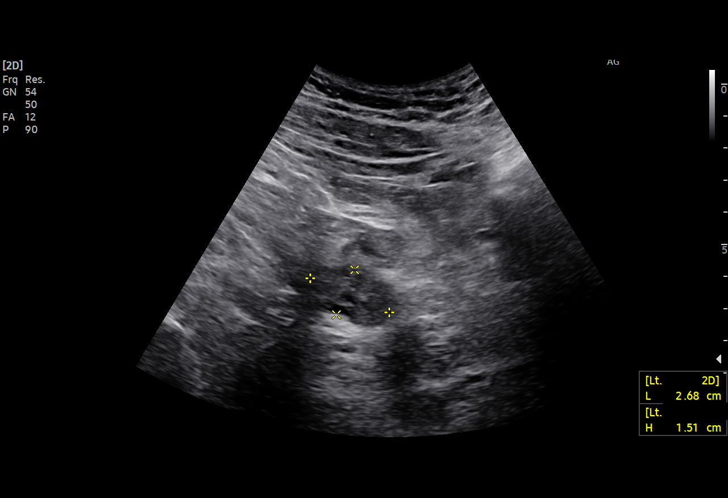
[im 30/58]
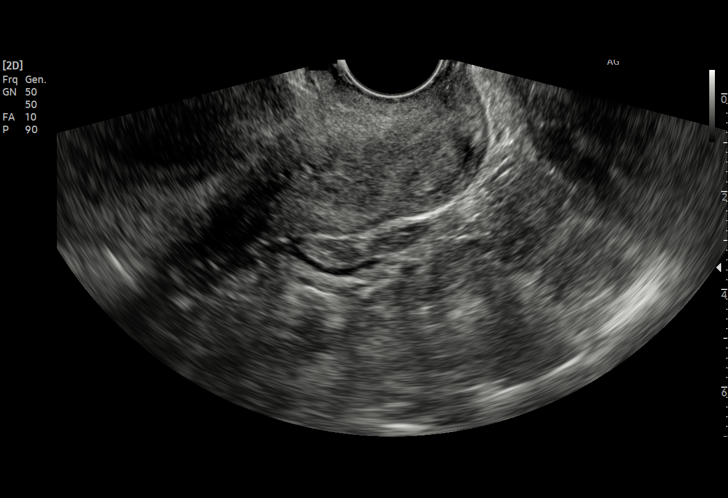
[im 32/58]
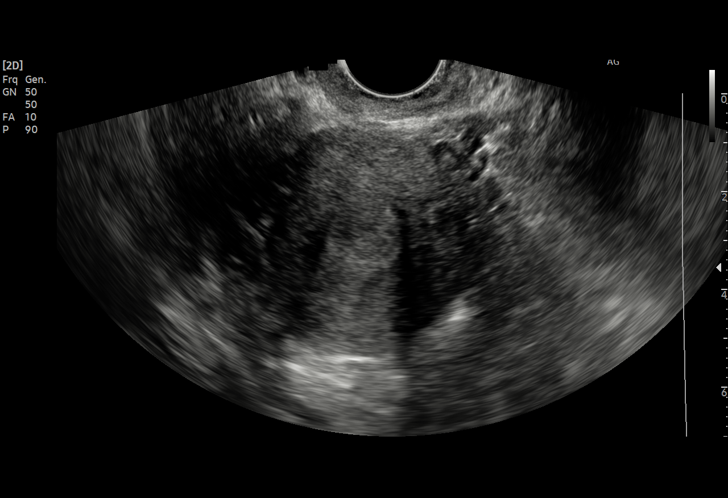
[im 36/58]
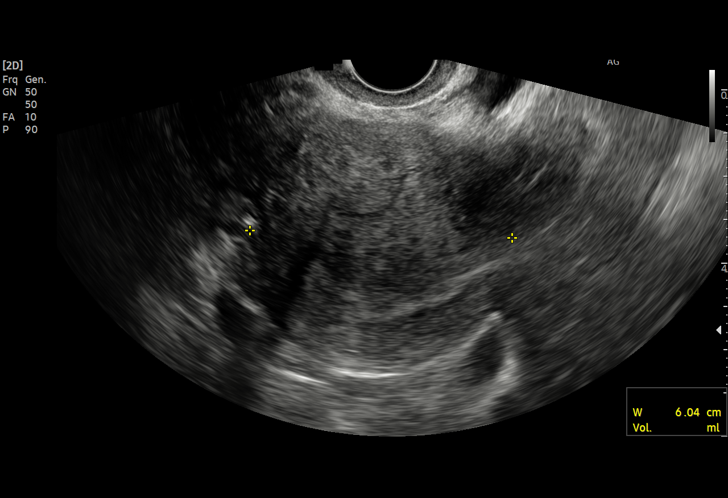
[im 41/58]
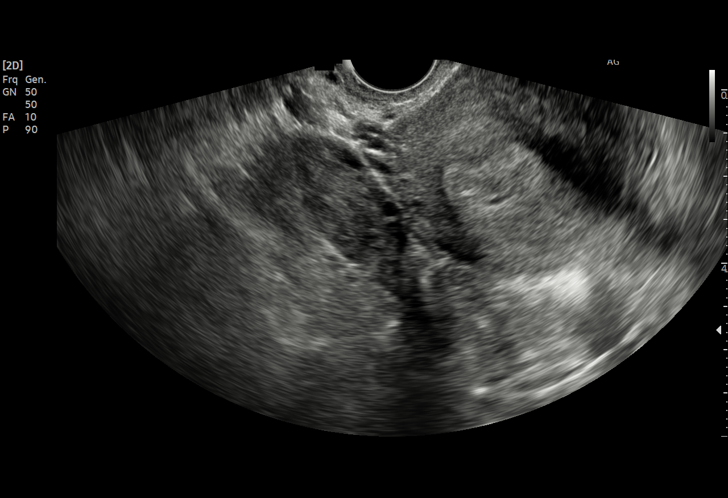
[im 45/58]
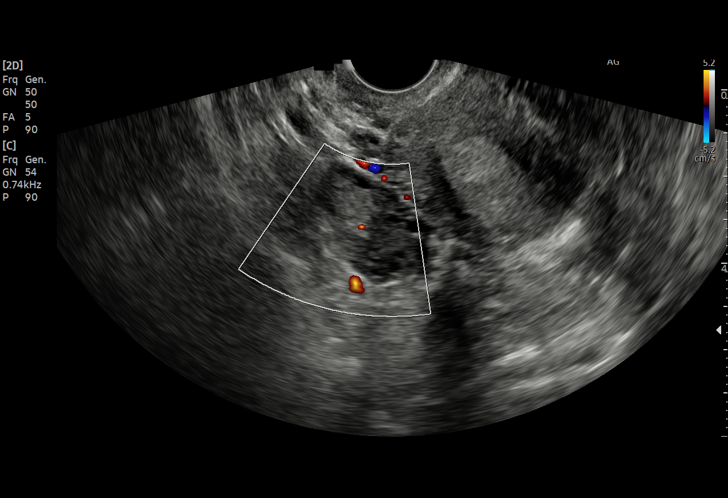
[im 49/58]
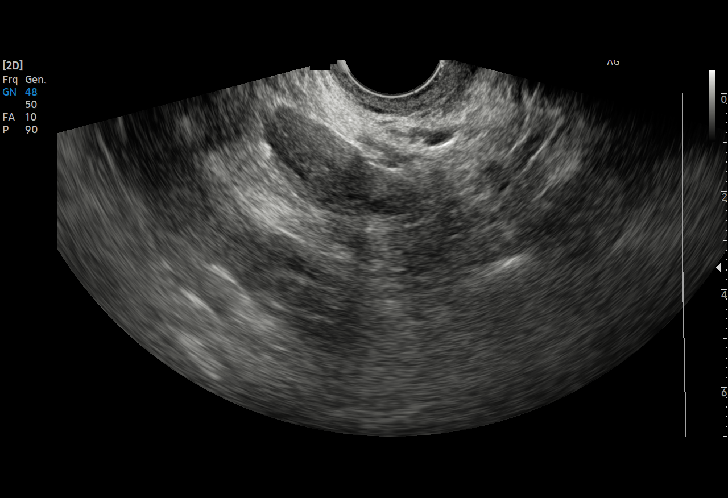
[im 53/58]
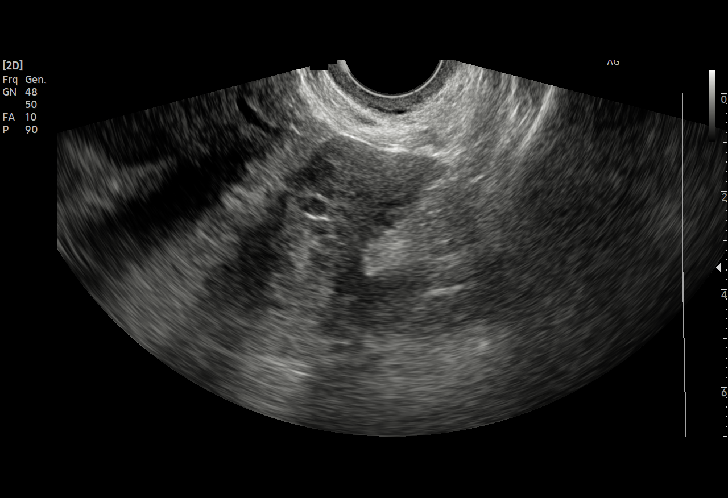
[im 58/58]
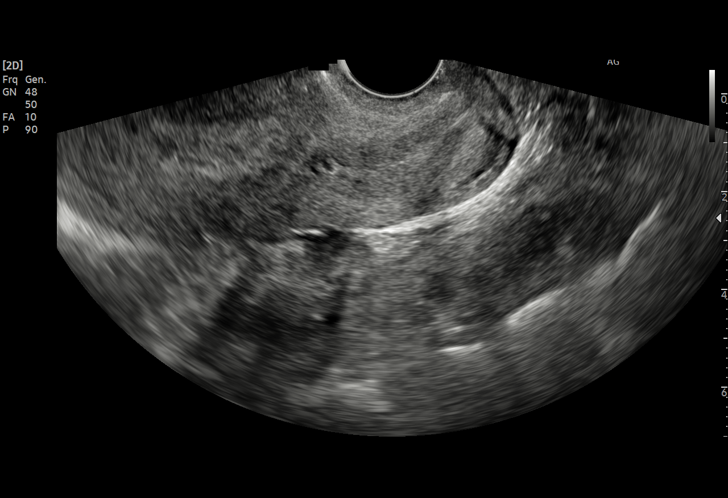

[15 of 28 positions shown; findings below may reference images not displayed]

FINDINGS: Intrauterine gestational sac: Single

Yolk sac:  Not Visualized.

Embryo:  Not Visualized.

Cardiac Activity: Not Visualized.

Heart Rate: n/a  bpm

MSD: 5.5 mm   by w   2 d

Subchorionic hemorrhage:  None visualized.

Maternal uterus/adnexae: The ovaries are normal in appearance.
IMPRESSION: Early intrauterine gestational sac with no yolk sac, fetal pole, or
cardiac activity yet visualized. Recommend follow-up quantitative
B-HCG levels and follow-up US in 14 days to assess viability. This
recommendation follows SRU consensus guidelines: Diagnostic Criteria
for Nonviable Pregnancy Early in the First Trimester. N Engl J Med

## 2022-05-29 IMAGING — US US OB < 14 WEEKS - US OB TV
1 series · 14 of 28 positions shown · non-contrast
Comparison: 07/13/2021

CLINICAL DATA: Lower abdominal pain

EXAM:
OBSTETRIC <14 WK US AND TRANSVAGINAL OB US
TECHNIQUE: Both transabdominal and transvaginal ultrasound examinations were
performed for complete evaluation of the gestation as well as the
maternal uterus, adnexal regions, and pelvic cul-de-sac.
Transvaginal technique was performed to assess early pregnancy.

[Series 1: us ob less than 14 weeks with ob transvaginal · 14 of 128 slices shown]
[im 5/128]
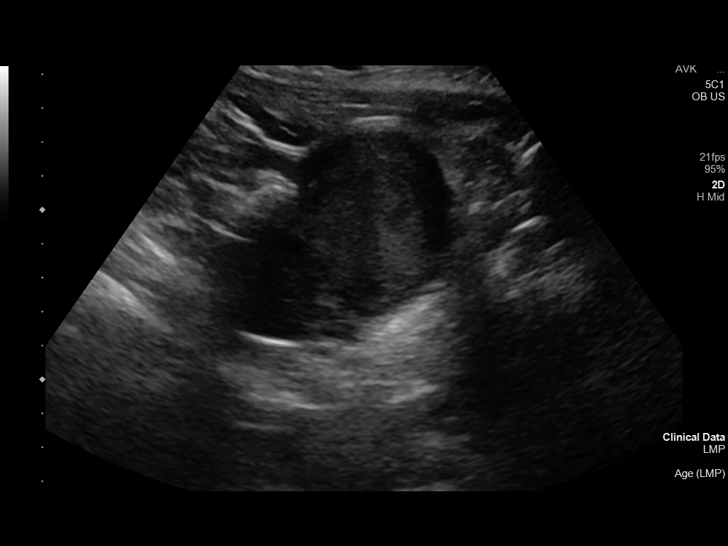
[im 15/128]
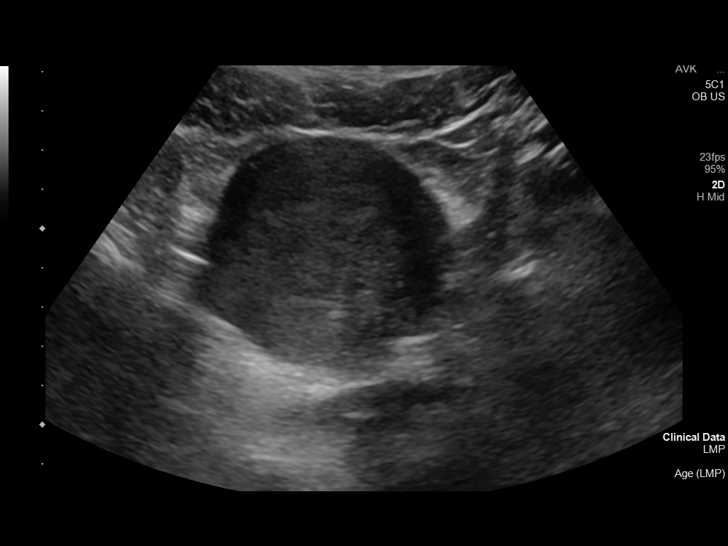
[im 24/128]
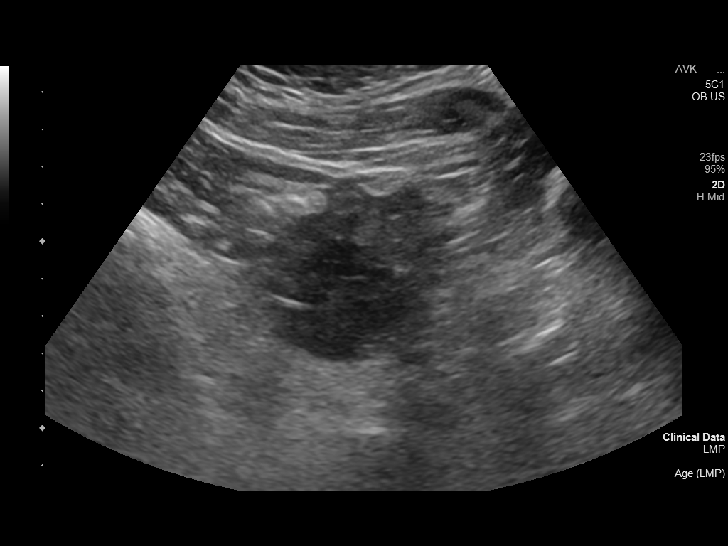
[im 33/128]
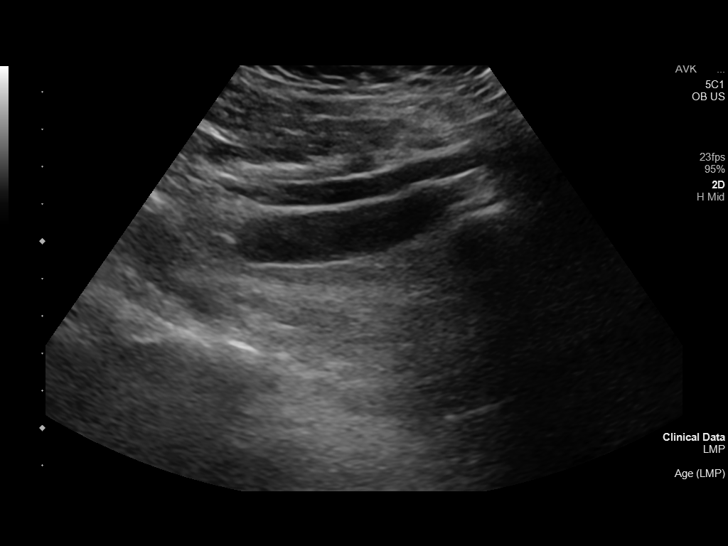
[im 43/128]
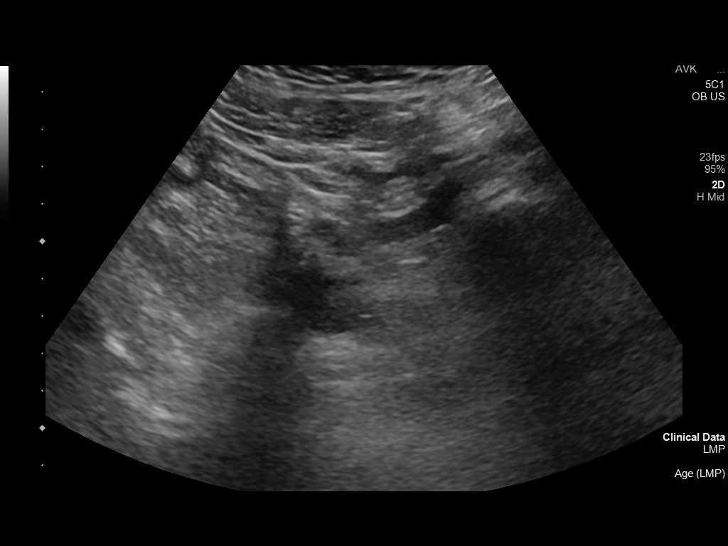
[im 52/128]
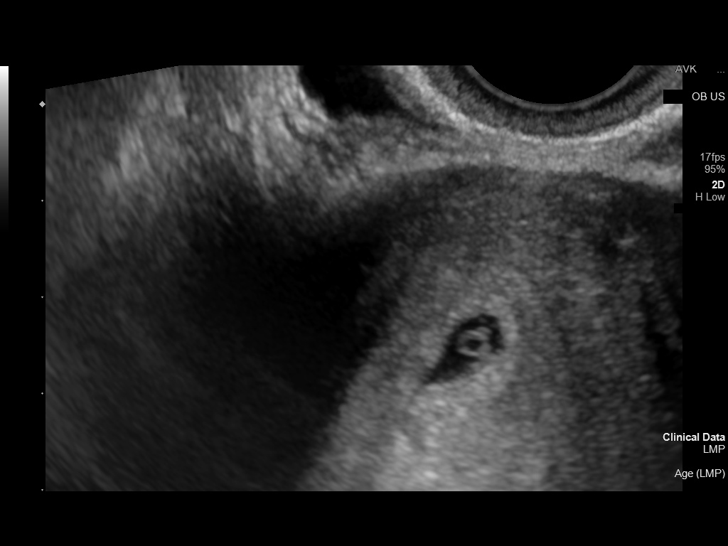
[im 62/128]
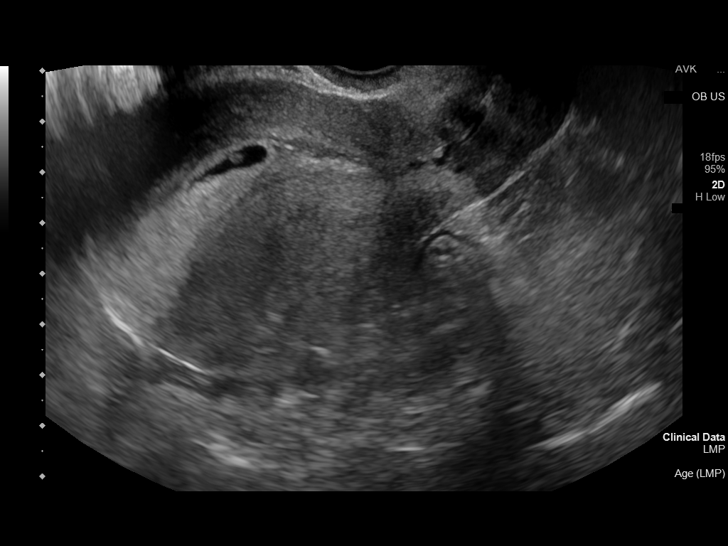
[im 71/128]
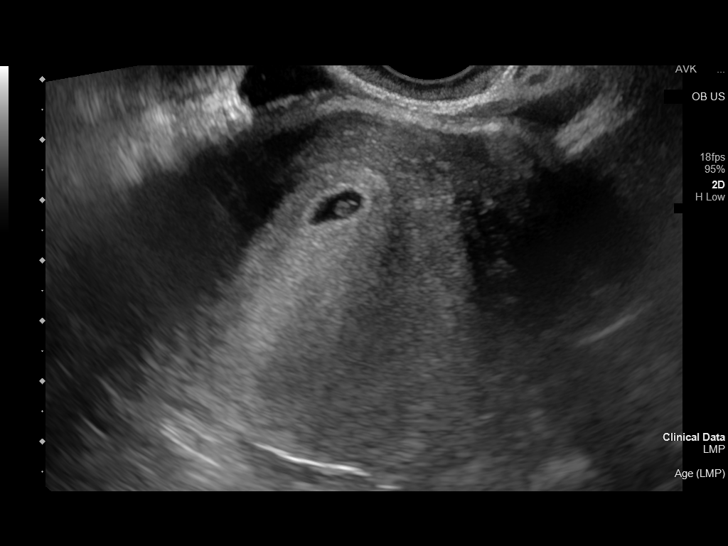
[im 80/128]
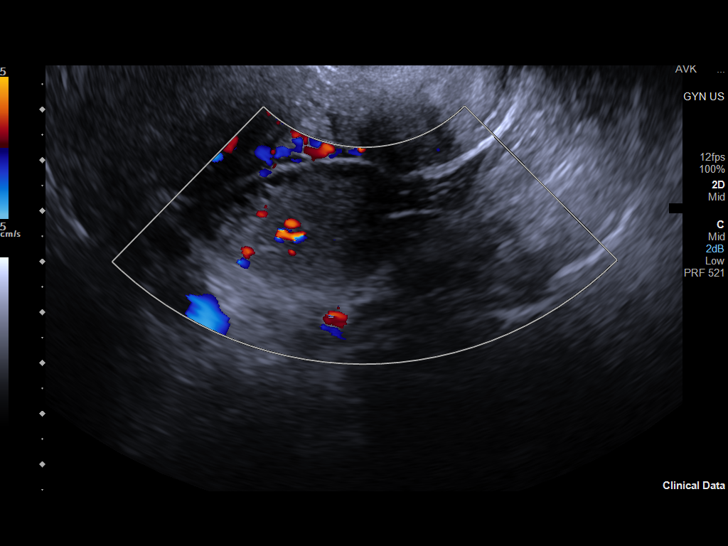
[im 90/128]
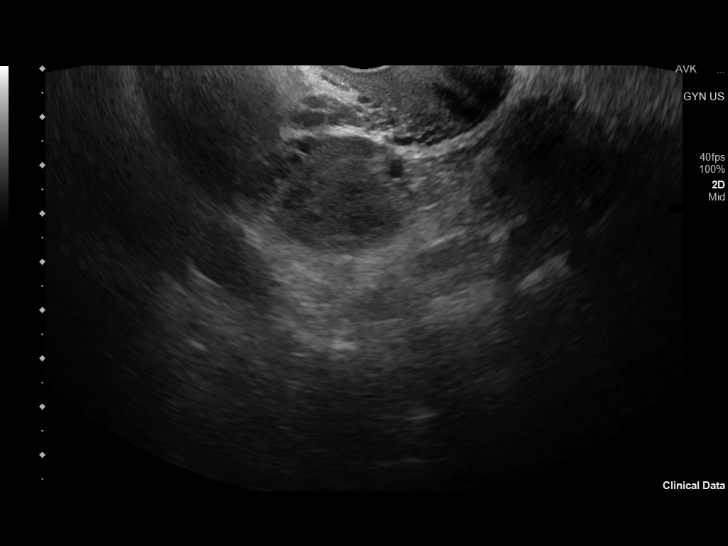
[im 99/128]
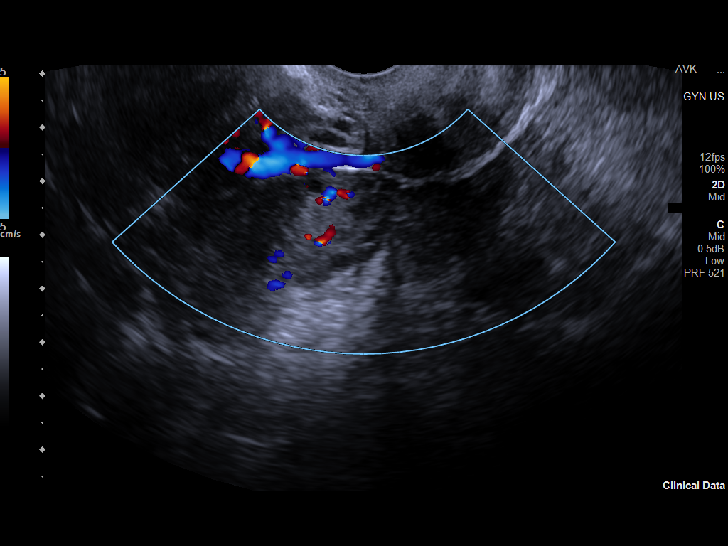
[im 109/128]
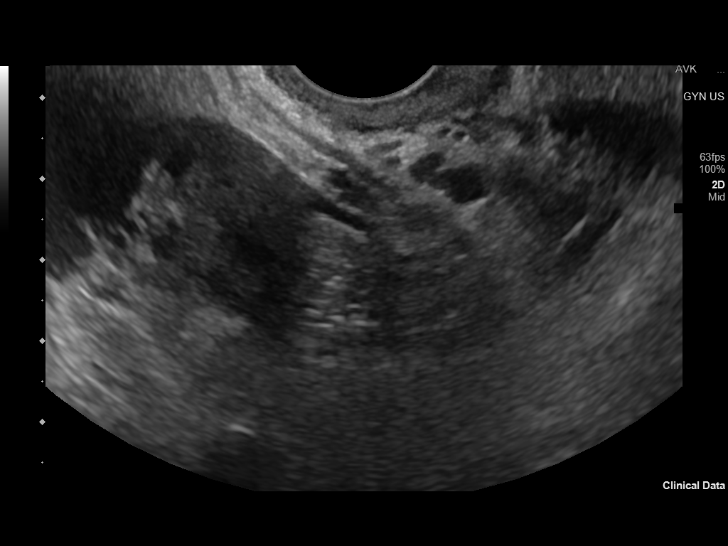
[im 118/128]
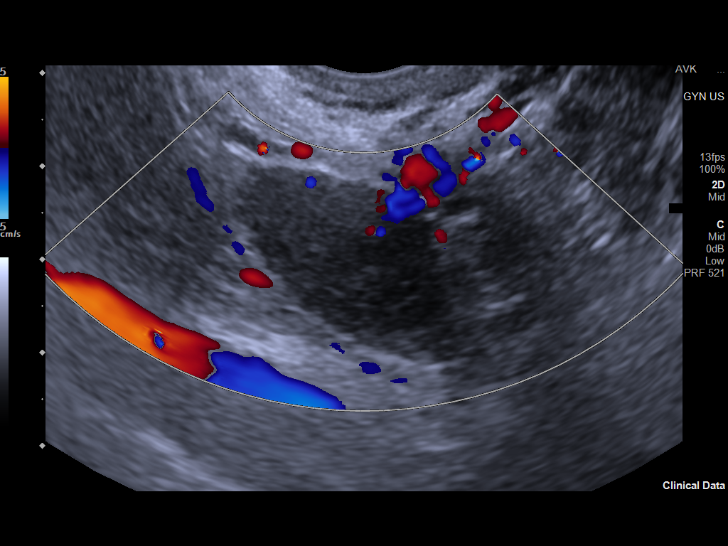
[im 128/128]
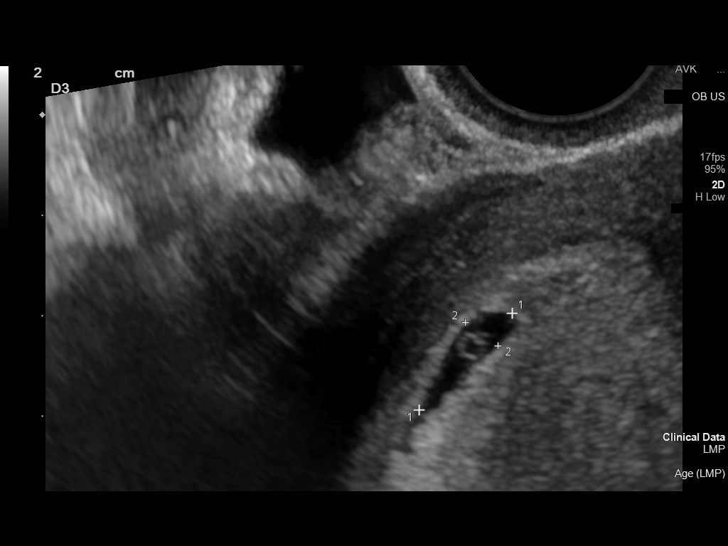

[14 of 28 positions shown; findings below may reference images not displayed]

FINDINGS: Intrauterine gestational sac: Single

Yolk sac:  Visualized

Embryo:  Questionably visualized

MSD: 8.4 mm   5 w   4 d

Subchorionic hemorrhage:  None visualized.

Maternal uterus/adnexae: Ovaries are within normal limits. Left
ovary measures 2.7 by 2.5 x 1.7 cm. The right ovary measures 2.9 x
2.7 x 2.8 cm. No significant free fluid.
IMPRESSION: Single intrauterine pregnancy with visible gestational sac and yolk
sac, questionable embryo. Suggest short interval follow-up in 10-14
days to confirm viability. Otherwise no specific abnormality is seen

## 2022-05-30 LAB — HEMOGLOBIN A1C CARE EVERYWHERE
HEMOGLOBIN A1C CARE EVERYWHERE: 5.7 % — ABNORMAL HIGH (ref 4.3–5.6)
MEAN BLOOD GLUCOSE CARE EVERYWHERE: 117 mg/dL

## 2022-11-10 NOTE — Progress Notes (Deleted)
This encounter was created in error - please disregard.

## 2022-11-19 LAB — TYPE AND SCREEN CARE EVERYWHERE
ANTIBODY SCREEN CARE EVERYWHERE: NEGATIVE
RH TYPE CARE EVERYWHERE: POSITIVE

## 2022-11-23 LAB — TYPE AND SCREEN CARE EVERYWHERE
ANTIBODY SCREEN CARE EVERYWHERE: NEGATIVE
RH TYPE CARE EVERYWHERE: POSITIVE

## 2022-12-31 LAB — HEMOGLOBIN A1C CARE EVERYWHERE
HEMOGLOBIN A1C CARE EVERYWHERE: 5.9 % — ABNORMAL HIGH (ref 4.3–5.6)
MEAN BLOOD GLUCOSE CARE EVERYWHERE: 123 mg/dL

## 2023-01-01 LAB — HEPATITIS B SURFACE ANTIBODY CARE EVERYWHERE
HEPATITIS B SURFACE ANTIBODY CARE EVERYWHERE: <3.31 m[IU]/mL
HEPATITIS B SURFACE ANTIBODY CARE EVERYWHERE: NEGATIVE

## 2023-01-27 ENCOUNTER — Encounter (HOSPITAL_BASED_OUTPATIENT_CLINIC_OR_DEPARTMENT_OTHER): Payer: Self-pay

## 2023-01-27 ENCOUNTER — Emergency Department
Admission: EM | Admit: 2023-01-27 | Discharge: 2023-01-27 | Disposition: A | Discharge status: Home / Self Care | Payer: Non-veteran care | Attending: Emergency Medicine | Admitting: Emergency Medicine

## 2023-01-27 DIAGNOSIS — R1030 Lower abdominal pain, unspecified: Secondary | ICD-10-CM | POA: Diagnosis present

## 2023-01-27 DIAGNOSIS — O26891 Other specified pregnancy related conditions, first trimester: Secondary | ICD-10-CM | POA: Diagnosis present

## 2023-01-27 DIAGNOSIS — Y92410 Unspecified street and highway as the place of occurrence of the external cause: Secondary | ICD-10-CM | POA: Diagnosis not present

## 2023-01-27 DIAGNOSIS — R109 Unspecified abdominal pain: Secondary | ICD-10-CM
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
## 2023-01-27 NOTE — Narrator Note (Signed)
Pt verbalizes understanding of d/c instructions and f/up   Departed ED with all belongings  steady gait

## 2023-01-27 NOTE — Narrator Note (Signed)
Patient Disposition  Patient education for diagnosis, medications, activity, diet and follow-up.  Patient left ED 5:52 PM.  Patient rep received written instructions.    Interpreter to provide instructions: NA    Patient belongings with patient: YES    Have all existing LDAs been addressed? NA    Have all IV infusions been stopped? N/A    Destination: Discharged to home
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
## 2023-03-10 ENCOUNTER — Other Ambulatory Visit: Payer: Self-pay

## 2023-03-10 ENCOUNTER — Emergency Department
Admission: EM | Admit: 2023-03-10 | Discharge: 2023-03-10 | Disposition: A | Discharge status: Home / Self Care | Payer: No Typology Code available for payment source | Attending: Student in an Organized Health Care Education/Training Program | Admitting: Student in an Organized Health Care Education/Training Program

## 2023-03-10 DIAGNOSIS — O26892 Other specified pregnancy related conditions, second trimester: Secondary | ICD-10-CM | POA: Insufficient documentation

## 2023-03-10 DIAGNOSIS — Z1152 Encounter for screening for COVID-19: Secondary | ICD-10-CM

## 2023-03-10 DIAGNOSIS — R0602 Shortness of breath: Secondary | ICD-10-CM | POA: Diagnosis present

## 2023-03-10 DIAGNOSIS — Z3A20 20 weeks gestation of pregnancy: Secondary | ICD-10-CM | POA: Diagnosis not present

## 2023-03-10 DIAGNOSIS — R079 Chest pain, unspecified: Secondary | ICD-10-CM | POA: Diagnosis present

## 2023-03-10 DIAGNOSIS — B349 Viral infection, unspecified: Secondary | ICD-10-CM

## 2023-03-10 LAB — URINALYSIS RFLX TO URINE CULT
BILIRUBIN, URINE: NEGATIVE
CASTS: NONE SEEN PER LPF
CRYSTALS: NONE SEEN
GLUCOSE, URINE: 100 MG/DL — AB
KETONE, URINE: NEGATIVE MG/DL
NITRITE, URINE: NEGATIVE
OCCULT BLOOD, URINE: NEGATIVE
PH URINE: 6.5 (ref 5.0–8.0)
PROTEIN, URINE: NEGATIVE MG/DL
SPECIFIC GRAVITY URINE: 1.015 (ref 1.003–1.035)

## 2023-03-10 LAB — RESPIRATORY PANEL BASIC INPAT
INFLUENZA A: NEGATIVE
INFLUENZA B: NEGATIVE
RESPIRATORY SYNCYTIAL VIRUS: NEGATIVE
SARS-COV-2: NEGATIVE

## 2023-03-10 LAB — CBC, PLATELET & DIFFERENTIAL
ABSOLUTE BASO COUNT: 0 10*3/uL (ref 0.0–0.1)
ABSOLUTE EOSINOPHIL COUNT: 0.3 10*3/uL (ref 0.0–0.8)
ABSOLUTE IMM GRAN COUNT: 0.27 10*3/uL — ABNORMAL HIGH (ref 0.00–0.10)
ABSOLUTE LYMPH COUNT: 1.9 10*3/uL (ref 0.6–5.9)
ABSOLUTE MONO COUNT: 0.7 10*3/uL (ref 0.2–1.4)
ABSOLUTE NEUTROPHIL COUNT: 6.5 10*3/uL (ref 1.6–8.3)
ABSOLUTE NRBC COUNT: 0 10*3/uL (ref 0.0–0.0)
BASOPHIL %: 0.2 % (ref 0.0–1.2)
EOSINOPHIL %: 3.1 % (ref 0.0–7.0)
HEMATOCRIT: 38.1 % (ref 34.1–44.9)
HEMOGLOBIN: 12.7 g/dL (ref 11.2–15.7)
IMMATURE GRANULOCYTE %: 2.8 % — ABNORMAL HIGH (ref 0.0–1.0)
LYMPHOCYTE %: 19.9 % (ref 15.0–54.0)
MEAN CORP HGB CONC: 33.3 g/dL (ref 31.0–37.0)
MEAN CORPUSCULAR HGB: 26.5 pg (ref 26.0–34.0)
MEAN CORPUSCULAR VOL: 79.4 fl — ABNORMAL LOW (ref 80.0–100.0)
MEAN PLATELET VOLUME: 13.3 fL — ABNORMAL HIGH (ref 8.7–12.5)
MONOCYTE %: 7.2 % (ref 4.0–13.0)
NEUTROPHIL %: 66.8 % (ref 40.0–75.0)
NRBC %: 0 % (ref 0.0–0.0)
PLATELET COUNT: 147 10*3/uL — ABNORMAL LOW (ref 150–400)
RBC DISTRIBUTION WIDTH STD DEV: 41.2 fL (ref 35.1–46.3)
RED BLOOD CELL COUNT: 4.8 M/uL (ref 3.90–5.20)
WHITE BLOOD CELL COUNT: 9.7 10*3/uL (ref 4.0–11.0)

## 2023-03-10 LAB — COMPREHENSIVE METABOLIC PANEL
ALANINE AMINOTRANSFERASE: 14 U/L (ref 12–45)
ALBUMIN: 3.8 g/dL (ref 3.4–5.2)
ALKALINE PHOSPHATASE: 70 U/L (ref 45–117)
ANION GAP: 14 mmol/L (ref 10–22)
ASPARTATE AMINOTRANSFERASE: 17 U/L (ref 8–34)
BILIRUBIN TOTAL: 0.2 mg/dL (ref 0.2–1.0)
BUN (UREA NITROGEN): 5 mg/dL — ABNORMAL LOW (ref 7–18)
CALCIUM: 9.7 mg/dL (ref 8.5–10.5)
CARBON DIOXIDE: 21 mmol/L (ref 21–32)
CHLORIDE: 103 mmol/L (ref 98–107)
CREATININE: 0.5 mg/dL (ref 0.4–1.2)
ESTIMATED GLOMERULAR FILT RATE: >60 mL/min (ref >60)
Glucose Random: 100 mg/dL (ref 74–160)
POTASSIUM: 3.8 mmol/L (ref 3.5–5.1)
SODIUM: 138 mmol/L (ref 136–145)
TOTAL PROTEIN: 6.9 g/dL (ref 6.4–8.2)

## 2023-03-10 LAB — HOLD GREEN TOP TUBE

## 2023-03-10 LAB — URINE CULTURE WILL NOT BE DONE

## 2023-03-10 LAB — D-DIMER PE/DVT, QUANTITATIVE: D-DIMER PE/DVT, QUANTITATIVE: 460 ng/mLFEU (ref 0.00–499)

## 2023-03-10 MED ORDER — ACETAMINOPHEN 500 MG PO TABS
1000.00 mg | ORAL_TABLET | Freq: Once | ORAL | Status: AC
Start: 2023-03-10 — End: 2023-03-10
  Administered 2023-03-10: 1000 mg via ORAL
  Filled 2023-03-10: qty 2

## 2023-03-10 MED ORDER — SODIUM CHLORIDE 0.9 % IV BOLUS
1000.0000 mL | Freq: Once | INTRAVENOUS | Status: AC
  Administered 2023-03-10: 1000 mL via INTRAVENOUS

## 2023-03-10 NOTE — ED Triage Note (Addendum)
"  I talked to my OB this morning. I'm [redacted] weeks pregnant. 20 tomorrow. I'm having chest tightness and sob. I took a covid test at home. Last night. It was negative. I took one this morning and it was faint positive. Then I checked the box and it was expired so I'm not sure if its valid or not. The fever is gone but I'm still have the sob and chest tightness." Symptoms started last night. Patient c/o abd cramping started last night too. Denies vag bleeding. Denies headche.

## 2023-03-10 NOTE — ED Notes (Signed)
Provided food  Provided fluid

## 2023-03-10 NOTE — Narrator Note (Signed)
Patient Disposition  Patient education for diagnosis, medications, activity, diet and follow-up.  Patient left ED 10:13 PM.  Patient rep received written instructions.    Interpreter to provide instructions: No    Patient belongings with patient: YES    Have all existing LDAs been addressed? Yes    Have all IV infusions been stopped? Yes    Destination: Admit to:  home

## 2023-03-10 NOTE — Discharge Instructions (Signed)
You are seen in the emergency department for shortness of breath and cough, with low-grade fever, this is likely a viral syndrome.  You are D-dimer which is a measure for possible blood clot was normal.  You can also have some shortness of breath and discomfort with breathing from pressure from the pregnancy on your diaphragm, please follow-up with your primary care doctor/OB/GYN for further care and management.  Please return to the emergency department for any new or worsening symptoms such as worsening cramping, or vaginal bleeding.

## 2023-03-10 NOTE — ED Provider Notes (Signed)
ED nursing record was reviewed. Prior records as available electronically through the Epic record were reviewed.    Patient was seen along with Dr. Dorothy Spark and management was discussed with them.      HPI:    This 22 year old female patient G2, P1 currently 19 weeks 6 days gravid who presents to the Emergency Department with chief complaint of chest tightness and shortness of breath.  Patient states that she had a fever and chills with sweats last night as well, she took a dose of Tylenol, and felt improved, with resolution of fever, she states Tmax was 100.2.  She denies any specific sick contacts.  She states that she has no history of DVT or PE, she is not a smoker, she is taking prenatal vitamins and aspirin, for hypertension in pregnancy.  She denies any swelling to the lower extremities.  Patient has had no hemoptysis, she states the shortness of breath and chest tightness is worse with standing up and with moving around improved somewhat with rest, she does have cough, but no production of phlegm, no fever today.  She has had some lower abdominal cramping, but no vaginal bleeding no vaginal discharge, no headache.  No medications taken today prior to arrival for pain.        ROS: Pertinent positives were reviewed as per the HPI above. All other systems were reviewed and are negative.      Past Medical History/Problem list:  Past Medical History:  No date: NO KNOWN PROBLEMS  There is no problem list on file for this patient.        Past Surgical History: Past Surgical History:  No date: NO SIGNIFICANT SURGICAL HISTORY      Medications: No current facility-administered medications on file prior to encounter.  ibuprofen (ADVIL,MOTRIN) 600 MG tablet, Take 1 tablet by mouth every 8 (eight) hours as needed pain, Disp: 20 tablet, Rfl: 0  TYLENOL CAPS 500 MG OR, None Entered, Disp: , Rfl:           Social History:   Social History     Socioeconomic History    Marital status: Single     Spouse name: Not on file     Number of children: Not on file    Years of education: Not on file    Highest education level: Not on file   Occupational History    Not on file   Tobacco Use    Smoking status: Never    Smokeless tobacco: Not on file   Substance and Sexual Activity    Alcohol use: No    Drug use: No    Sexual activity: Never   Other Topics Concern    Not on file   Social History Narrative    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; lives with family   Social Determinants of Health  Financial Resource Strain: Not on file  Food Insecurity: Not on file  Transportation Needs: Not on file  Physical Activity: Not on file  Stress: Not on file  Social Connections: Not on file  Intimate Partner Violence: Not on file  Housing Stability: Medium Risk (12/28/2022)      [facility]      Residential Stability          What is your housing situation today?: I am staying with others          How many times have you moved in the past 12 months?: One  time        Allergies:  Review of Patient's Allergies indicates:  No Known Allergies      Physical Exam:  BP 120/95   Pulse 103   Temp 98 F (Oral)   Resp 17   LMP 10/15/2022   SpO2 100%     GENERAL: Well appearing, No acute distress, non-toxic.   SKIN:  Warm & Dry, no erythema or rash.  HEAD:  NCAT. Sclerae are anicteric and aninjected, oropharynx is clear with moist mucous membranes. PERRL. EOMI. B TMs clear.  NECK:   No LAN. No stridor.  LUNGS:  Clear to auscultation bilaterally. No wheezes, rales, rhonchi.  Patient breathing comfortably no respiratory distress  HEART:  RRR.    ABDOMEN:  Soft, appropriately gravid   EXTREMITIES:  No obvious deformities.  CSM intact x 4.    NEUROLOGIC:  Alert and oriented x4; moves all extremities well; speaking in clear fluent sentences.   PSYCHIATRIC:  Appropriate for age, time of day, and situation           ED Course and Medical Decision-making:  This 23 year old female patient who presents to the emergency department with  shortness of breath and pregnancy.  Patient could be having shortness of breath secondary to side effects of pregnancy, however DVT upper respiratory tract infection dehydration were also considered.  Patient has normal D-dimer.  EKG shows no acute ischemic change or arrhythmia to explain her symptoms.  Patient was given Tylenol, and IV fluids with some improvement, no other signs of dehydration or significant findings on CBC or CMP no anemia to explain her symptoms.  Patient was reassured, and advised to follow-up with PCP/OB/GYN, she understands and is in agreement with her care plan at this time was discharged home.        Condition: Improved stable  Disposition: Home      Diagnosis/Diagnoses:  Shortness of breath  [redacted] weeks gestation of pregnancy  Viral syndrome    Gilmore Laroche, PA-C    This Emergency Department patient encounter note was created using voice-recognition software and in real time during the ED visit. Please excuse any typographical errors that have not been edited out.

## 2023-03-13 LAB — EKG

## 2023-03-18 ENCOUNTER — Other Ambulatory Visit: Payer: Self-pay

## 2023-03-18 ENCOUNTER — Emergency Department
Admission: EM | Admit: 2023-03-18 | Discharge: 2023-03-18 | Disposition: A | Discharge status: Home / Self Care | Payer: No Typology Code available for payment source | Attending: Physician Assistant | Admitting: Physician Assistant

## 2023-03-18 DIAGNOSIS — L02419 Cutaneous abscess of limb, unspecified: Secondary | ICD-10-CM

## 2023-03-18 DIAGNOSIS — L03116 Cellulitis of left lower limb: Secondary | ICD-10-CM | POA: Diagnosis present

## 2023-03-18 MED ORDER — LIDOCAINE HCL 1 % IJ SOLN
20.00 mL | Freq: Once | INTRAMUSCULAR | Status: AC
Start: 2023-03-18 — End: 2023-03-18
  Administered 2023-03-18: 20 mL via INTRADERMAL
  Filled 2023-03-18: qty 20

## 2023-03-18 NOTE — Narrator Note (Signed)
Patient Disposition  Patient education for diagnosis, medications, activity, diet and follow-up.  Patient left ED 6:10 PM.  Patient rep received written instructions.    Interpreter to provide instructions: No    Patient belongings with patient: YES    Have all existing LDAs been addressed? N/A    Have all IV infusions been stopped? N/A    Destination: Discharged to home. Pt expressed understanding of discharge material, including instructions and applicable medications. Pt expressed understanding of follow up instructions, as well as signs/symptoms to return to ED. Pt ambulatory out of ED w/steady gait.

## 2023-03-18 NOTE — ED Triage Note (Signed)
Patient arrives to ED ambulatory, states that approx 3-4 days ago she noticed a "boil" behind her left leg. She states that at one point it was hard and painful, now it is leaking some yellow fluid.

## 2023-03-18 NOTE — Discharge Instructions (Signed)
Continue to apply warm compress to allow the abscess to drain.  Change dressing when soiled, wet, or at least every 2 days.    Take tylenol as needed for pain     If you notice any increase redness, pain, you may need antibiotics.   Please either see your doctor or come back to the ER for re-evaluation.

## 2023-03-18 NOTE — ED Provider Notes (Signed)
Physician Assistant Provider Note    Arrival time: 90  Presented to [facility] via Self    Interpeter: None    [place] chart, ED Triage and nursing notes reviewed.  External records reviewed: Care Everywhere      History of Present Illness:   22 year old female presents with chief concern of left posterior thigh abscess x 5 days  Patient is [redacted] weeks gestation, currently pregnant, history of hidradenitis suppurativa.   Developed a boil on the back of her left upper thigh, has been doing warm compresses, but the past 5 days, she noticed it was not really going away.  There is yellow drainage, no fevers          ROS: Pertinent positives and negatives were reviewed as per the HPI above.         Past Medical History: hidradentitis suppurativa     Past Surgical History: Past Surgical History:  No date: NO SIGNIFICANT SURGICAL HISTORY    Allergies:  Review of Patient's Allergies indicates:  No Known Allergies  Medications: No current facility-administered medications on file prior to encounter.  ibuprofen (ADVIL,MOTRIN) 600 MG tablet, Take 1 tablet by mouth every 8 (eight) hours as needed pain, Disp: 20 tablet, Rfl: 0  TYLENOL CAPS 500 MG OR, None Entered, Disp: , Rfl:         Social & Family Hx: noncontributory  Social Determinants of Health - none         Physical Exam:  ED Triage Vitals [03/18/23 1314]   ED Triage Vitals Brief Group      Temp 96.9 F      Pulse 110      Resp 16      BP 142/88      SpO2 98 %      Pain Score        GENERAL: Well appearing, No acute distress, non-toxic.   SKIN: 3 cm indurated abscess with 0.5 cm opening with visible purulent drainage, minimal surrounding discoloration    EYES: PERRL, EOMI grossly intact.   LUNGS:  CTAB. Full aeration. No wheezes, rales, rhonchi.   CV:  RRR.  No murmurs, rubs, or gallops.   EXTREMITIES:  No obvious deformities.  NEUROLOGIC: Alert and oriented x3;     VS trend     Patient Vitals for the past 72 hrs:   Temp Pulse Resp BP SpO2   03/18/23 1314 96.9 F  110 16 142/88 98 %          Diagnostics:          Imaging:  [place] bedside L posterior thigh - 1.5 cm collection       Therapeutics:       Medications   lidocaine 1 % injection 20 mL (has no administration in time range)             Medical Decision Making     22 year old female presents with chief concern of left posterior thigh abscess x 5 days     VSS  Pt w/o any systemic symptoms.  Since not draining adequately w/ increased pain, will widen opening with I and D    Pt is pregnant and desires minimal ingestion of medicine if possible.  Discussed the use of topical lidocaine which patient agreed to, using the least amount possible to avoid causing any unwanted side effects to fetus.     Procedure (Incision and Drainage):   Verbal consent obtained. Abscess approximately 3  cm in diameter to L posterior thigh. I cleaned this area with iodine and alcohol. I anesthestized the area with 3ccs of lidocaine 1% w/o epi. I made a 1 in incision to the most fluctuant area. Small amount of pus obtained. Wound culture WAS NOT obtained. Area explored w/ blunt dissection and then irrigated w/ copious amount of normal saline. This abscess was not packed. Gauze and tagaderm dressing. Patient tolerated procedure well. Care instructions given.     Since there is minimal cellulitis, will not recommend abx at this time.  Continue warm compresses. Tylenol as needed for pain      Return precautions if symptoms worsen.  Followup with primary care.   ___________________________________________________________________    Diagnosis: abscess    Condition: stable    Disposition: home             Grant Fontana, PA-C     Emergency Department  [facility]
